# Patient Record
Sex: Female | Born: 1965 | Race: Black or African American | Hispanic: No | Marital: Single | Smoking: Never smoker
Health system: Southern US, Community
[De-identification: ages and names within clinical notes are randomized; demographics above are authoritative.]

## PROBLEM LIST (undated history)

## (undated) DIAGNOSIS — Z5189 Encounter for other specified aftercare: Secondary | ICD-10-CM

## (undated) DIAGNOSIS — T7840XA Allergy, unspecified, initial encounter: Secondary | ICD-10-CM

## (undated) DIAGNOSIS — F32A Depression, unspecified: Secondary | ICD-10-CM

## (undated) DIAGNOSIS — Z860100 Personal history of colon polyps, unspecified: Secondary | ICD-10-CM

## (undated) DIAGNOSIS — Z8669 Personal history of other diseases of the nervous system and sense organs: Secondary | ICD-10-CM

## (undated) DIAGNOSIS — A6920 Lyme disease, unspecified: Secondary | ICD-10-CM

## (undated) DIAGNOSIS — F329 Major depressive disorder, single episode, unspecified: Secondary | ICD-10-CM

## (undated) DIAGNOSIS — Z9049 Acquired absence of other specified parts of digestive tract: Secondary | ICD-10-CM

## (undated) DIAGNOSIS — J45909 Unspecified asthma, uncomplicated: Secondary | ICD-10-CM

## (undated) DIAGNOSIS — M069 Rheumatoid arthritis, unspecified: Secondary | ICD-10-CM

## (undated) DIAGNOSIS — Z8619 Personal history of other infectious and parasitic diseases: Secondary | ICD-10-CM

## (undated) DIAGNOSIS — M797 Fibromyalgia: Secondary | ICD-10-CM

## (undated) HISTORY — PX: CARPAL TUNNEL RELEASE: SHX101

## (undated) HISTORY — DX: Lyme disease, unspecified: A69.20

## (undated) HISTORY — DX: Personal history of colonic polyps: Z86.010

## (undated) HISTORY — DX: Allergy, unspecified, initial encounter: T78.40XA

## (undated) HISTORY — PX: TONSILLECTOMY: SHX5217

## (undated) HISTORY — PX: ABDOMINAL HYSTERECTOMY: SHX81

## (undated) HISTORY — PX: KNEE ARTHROPLASTY: SHX992

## (undated) HISTORY — DX: Major depressive disorder, single episode, unspecified: F32.9

## (undated) HISTORY — DX: Depression, unspecified: F32.A

## (undated) HISTORY — DX: Personal history of other infectious and parasitic diseases: Z86.19

## (undated) HISTORY — DX: Acquired absence of other specified parts of digestive tract: Z90.49

## (undated) HISTORY — DX: Encounter for other specified aftercare: Z51.89

## (undated) HISTORY — PX: TOTAL SHOULDER ARTHROPLASTY: SHX126

## (undated) HISTORY — DX: Fibromyalgia: M79.7

## (undated) HISTORY — DX: Rheumatoid arthritis, unspecified: M06.9

## (undated) HISTORY — DX: Personal history of other diseases of the nervous system and sense organs: Z86.69

## (undated) HISTORY — PX: SPINE SURGERY: SHX786

## (undated) HISTORY — DX: Personal history of colon polyps, unspecified: Z86.0100

---

## 2013-02-22 DIAGNOSIS — Z9049 Acquired absence of other specified parts of digestive tract: Secondary | ICD-10-CM

## 2013-02-22 HISTORY — DX: Acquired absence of other specified parts of digestive tract: Z90.49

## 2015-05-12 ENCOUNTER — Emergency Department (HOSPITAL_BASED_OUTPATIENT_CLINIC_OR_DEPARTMENT_OTHER)
Admission: EM | Admit: 2015-05-12 | Discharge: 2015-05-12 | Disposition: A | Discharge status: Home / Self Care | Payer: BLUE CROSS/BLUE SHIELD | Attending: Emergency Medicine | Admitting: Emergency Medicine

## 2015-05-12 ENCOUNTER — Emergency Department (HOSPITAL_BASED_OUTPATIENT_CLINIC_OR_DEPARTMENT_OTHER): Payer: BLUE CROSS/BLUE SHIELD

## 2015-05-12 ENCOUNTER — Encounter (HOSPITAL_BASED_OUTPATIENT_CLINIC_OR_DEPARTMENT_OTHER): Payer: Self-pay | Admitting: Emergency Medicine

## 2015-05-12 DIAGNOSIS — Z7951 Long term (current) use of inhaled steroids: Secondary | ICD-10-CM | POA: Insufficient documentation

## 2015-05-12 DIAGNOSIS — K219 Gastro-esophageal reflux disease without esophagitis: Secondary | ICD-10-CM | POA: Diagnosis not present

## 2015-05-12 DIAGNOSIS — IMO0001 Reserved for inherently not codable concepts without codable children: Secondary | ICD-10-CM

## 2015-05-12 DIAGNOSIS — H547 Unspecified visual loss: Secondary | ICD-10-CM | POA: Insufficient documentation

## 2015-05-12 DIAGNOSIS — Z79899 Other long term (current) drug therapy: Secondary | ICD-10-CM | POA: Diagnosis not present

## 2015-05-12 DIAGNOSIS — J45901 Unspecified asthma with (acute) exacerbation: Secondary | ICD-10-CM | POA: Insufficient documentation

## 2015-05-12 DIAGNOSIS — R079 Chest pain, unspecified: Secondary | ICD-10-CM | POA: Diagnosis present

## 2015-05-12 HISTORY — DX: Unspecified asthma, uncomplicated: J45.909

## 2015-05-12 LAB — CBC
HCT: 39 % (ref 36.0–46.0)
HEMOGLOBIN: 12.7 g/dL (ref 12.0–15.0)
MCH: 27.2 pg (ref 26.0–34.0)
MCHC: 32.6 g/dL (ref 30.0–36.0)
MCV: 83.5 fL (ref 78.0–100.0)
PLATELETS: 233 10*3/uL (ref 150–400)
RBC: 4.67 MIL/uL (ref 3.87–5.11)
RDW: 14.8 % (ref 11.5–15.5)
WBC: 7.7 10*3/uL (ref 4.0–10.5)

## 2015-05-12 LAB — BASIC METABOLIC PANEL
ANION GAP: 7 (ref 5–15)
BUN: 18 mg/dL (ref 6–20)
CALCIUM: 9.1 mg/dL (ref 8.9–10.3)
CO2: 26 mmol/L (ref 22–32)
Chloride: 104 mmol/L (ref 101–111)
Creatinine, Ser: 0.81 mg/dL (ref 0.44–1.00)
GLUCOSE: 96 mg/dL (ref 65–99)
POTASSIUM: 4.4 mmol/L (ref 3.5–5.1)
SODIUM: 137 mmol/L (ref 135–145)

## 2015-05-12 LAB — TROPONIN I: Troponin I: <0.03 ng/mL (ref <0.031)

## 2015-05-12 MED ORDER — RANITIDINE HCL 150 MG/10ML PO SYRP
150.0000 mg | ORAL_SOLUTION | Freq: Once | ORAL | Status: AC
  Administered 2015-05-12: 150 mg via ORAL
  Filled 2015-05-12: qty 10

## 2015-05-12 MED ORDER — RANITIDINE HCL 150 MG PO CAPS: 150.0000 mg | ORAL_CAPSULE | Freq: Every day | ORAL | Status: DC

## 2015-05-12 MED ORDER — ACETAMINOPHEN 325 MG PO TABS
650.0000 mg | ORAL_TABLET | Freq: Once | ORAL | Status: AC
  Administered 2015-05-12: 650 mg via ORAL
  Filled 2015-05-12: qty 2

## 2015-05-12 NOTE — Discharge Instructions (Signed)
Food Choices for Gastroesophageal Reflux Disease, Adult When you have gastroesophageal reflux disease (GERD), the foods you eat and your eating habits are very important. Choosing the right foods can help ease your discomfort.  WHAT GUIDELINES DO I NEED TO FOLLOW?   Choose fruits, vegetables, whole grains, and low-fat dairy products.   Choose low-fat meat, fish, and poultry.  Limit fats such as oils, salad dressings, butter, nuts, and avocado.   Keep a food diary. This helps you identify foods that cause symptoms.   Avoid foods that cause symptoms. These may be different for everyone.   Eat small meals often instead of 3 large meals a day.   Eat your meals slowly, in a place where you are relaxed.   Limit fried foods.   Cook foods using methods other than frying.   Avoid drinking alcohol.   Avoid drinking large amounts of liquids with your meals.   Avoid bending over or lying down until 2-3 hours after eating.  WHAT FOODS ARE NOT RECOMMENDED?  These are some foods and drinks that may make your symptoms worse: Vegetables Tomatoes. Tomato juice. Tomato and spaghetti sauce. Chili peppers. Onion and garlic. Horseradish. Fruits Oranges, grapefruit, and lemon (fruit and juice). Meats High-fat meats, fish, and poultry. This includes hot dogs, ribs, ham, sausage, salami, and bacon. Dairy Whole milk and chocolate milk. Sour cream. Cream. Butter. Ice cream. Cream cheese.  Drinks Coffee and tea. Bubbly (carbonated) drinks or energy drinks. Condiments Hot sauce. Barbecue sauce.  Sweets/Desserts Chocolate and cocoa. Donuts. Peppermint and spearmint. Fats and Oils High-fat foods. This includes [place] fries and potato chips. Other Vinegar. Strong spices. This includes black pepper, white pepper, red pepper, cayenne, curry powder, cloves, ginger, and chili powder. The items listed above may not be a complete list of foods and drinks to avoid. Contact your dietitian for more  information.   This information is not intended to replace advice given to you by your health care provider. Make sure you discuss any questions you have with your health care provider.   Document Released: 08/10/2011 Document Revised: 03/01/2014 Document Reviewed: 12/13/2012 Elsevier Interactive Patient Education 2016 Else[employer] Inc.  Gastroesophageal Reflux Disease, Adult Normally, food travels down the esophagus and stays in the stomach to be digested. If a person has gastroesophageal reflux disease (GERD), food and stomach acid move back up into the esophagus. When this happens, the esophagus becomes sore and swollen (inflamed). Over time, GERD can make small holes (ulcers) in the lining of the esophagus. HOME CARE Diet  Follow a diet as told by your doctor. You may need to avoid foods and drinks such as:  Coffee and tea (with or without caffeine).  Drinks that contain alcohol.  Energy drinks and sports drinks.  Carbonated drinks or sodas.  Chocolate and cocoa.  Peppermint and mint flavorings.  Garlic and onions.  Horseradish.  Spicy and acidic foods, such as peppers, chili powder, curry powder, vinegar, hot sauces, and BBQ sauce.  Citrus fruit juices and citrus fruits, such as oranges, lemons, and limes.  Tomato-based foods, such as red sauce, chili, salsa, and pizza with red sauce.  Fried and fatty foods, such as donuts, french fries, potato chips, and high-fat dressings.  High-fat meats, such as hot dogs, rib eye steak, sausage, ham, and bacon.  High-fat dairy items, such as whole milk, butter, and cream cheese.  Eat small meals often. Avoid eating large meals.  Avoid drinking large amounts of liquid with your meals.  Avoid eating meals  during the 2-3 hours before bedtime.  Avoid lying down right after you eat.  Do not exercise right after you eat. General Instructions  Pay attention to any changes in your symptoms.  Take over-the-counter and  prescription medicines only as told by your doctor. Do not take aspirin, ibuprofen, or other NSAIDs unless your doctor says it is okay.  Do not use any tobacco products, including cigarettes, chewing tobacco, and e-cigarettes. If you need help quitting, ask your doctor.  Wear loose clothes. Do not wear anything tight around your waist.  Raise (elevate) the head of your bed about 6 inches (15 cm).  Try to lower your stress. If you need help doing this, ask your doctor.  If you are overweight, lose an amount of weight that is healthy for you. Ask your doctor about a safe weight loss goal.  Keep all follow-up visits as told by your doctor. This is important. GET HELP IF:  You have new symptoms.  You lose weight and you do not know why it is happening.  You have trouble swallowing, or it hurts to swallow.  You have wheezing or a cough that keeps happening.  Your symptoms do not get better with treatment.  You have a hoarse voice. GET HELP RIGHT AWAY IF:  You have pain in your arms, neck, jaw, teeth, or back.  You feel sweaty, dizzy, or light-headed.  You have chest pain or shortness of breath.  You throw up (vomit) and your throw up looks like blood or coffee grounds.  You pass out (faint).  Your poop (stool) is bloody or black.  You cannot swallow, drink, or eat.   This information is not intended to replace advice given to you by your health care provider. Make sure you discuss any questions you have with your health care provider.   Document Released: 07/28/2007 Document Revised: 10/30/2014 Document Reviewed: 06/05/2014 Elsevier Interactive Patient Education [employer]

## 2015-05-12 NOTE — ED Provider Notes (Signed)
CSN: [id number]     Arrival date & time 05/12/15  2138 History  By signing my name below, I, Budd Palmer, attest that this documentation has been prepared under the direction and in the presence of Nelva Nay, MD. Electronically Signed: Budd Palmer, ED Scribe. 05/12/2015. 10:01 PM.     Chief Complaint  Patient presents with  . Chest Pain   The history is provided by the patient. No language interpreter was used.   HPI Comments: Julie Kramer is a 50 y.o. female with a PMHx of asthma and a PSHx of spine surgery who presents to the Emergency Department complaining of intermittent chest pain onset 1 week ago. Pt states that at first she felt as though it was her asthma acting up, but then the pain began to radiate into her left arm. She reports associated mild wheezing, loss of appetite, and brief loss of vision while at work today. She also endorses coughing, but states that this is baseline for her. She notes that a few nights ago she woke up in the middle of the night with palpitations. She has an inhaler at home which she has not used today. She notes she was diagnosed with heartburn and was given medication for this by her PCP, which she takes PRN. She denies a PMHx of HTN and DM, as well as taking any medication for high cholesterol.  Pt is allergic to hydrocodone.   Past Medical History  Diagnosis Date  . Asthma    Past Surgical History  Procedure Laterality Date  . Spine surgery     History reviewed. No pertinent family history. Social History  Substance Use Topics  . Smoking status: Never Smoker   . Smokeless tobacco: None  . Alcohol Use: No   OB History    No data available     Review of Systems  Constitutional: Positive for appetite change.  Eyes: Positive for visual disturbance.  Respiratory: Positive for cough (baseline) and wheezing.   Cardiovascular: Positive for chest pain.  All other systems reviewed and are negative.   Allergies   Hydrocodone  Home Medications   Prior to Admission medications   Medication Sig Start Date End Date Taking? Authorizing Provider  albuterol (PROVENTIL HFA;VENTOLIN HFA) 108 (90 Base) MCG/ACT inhaler Inhale into the lungs every 6 (six) hours as needed for wheezing or shortness of breath.   Yes Historical Provider, MD  budesonide-formoterol (SYMBICORT) 160-4.5 MCG/ACT inhaler Inhale 2 puffs into the lungs 2 (two) times daily.   Yes Historical Provider, MD   BP 138/101 mmHg  Pulse 83  Temp(Src) 98.1 F (36.7 C) (Oral)  Resp 18  Ht 5\' 2"  (1.575 m)  Wt 186 lb (84.369 kg)  BMI 34.01 kg/m2  SpO2 100% Physical Exam Physical Exam  Nursing note and vitals reviewed. Constitutional: She is oriented to person, place, and time. She appears well-developed and well-nourished. No distress.  HENT:  Head: Normocephalic and atraumatic.  Eyes: Pupils are equal, round, and reactive to light.  Neck: Normal range of motion.  Cardiovascular: Normal rate and intact distal pulses.   Pulmonary/Chest: No respiratory distress.  Abdominal: Normal appearance. She exhibits no distension.  Musculoskeletal: Normal range of motion.  Neurological: She is alert and oriented to person, place, and time. No cranial nerve deficit.  Skin: Skin is warm and dry. No rash noted.   ED Course  Procedures  Medications  ranitidine (ZANTAC) 150 MG/10ML syrup 150 mg (150 mg Oral Given 05/12/15 2321)  acetaminophen (TYLENOL) tablet 650  mg (650 mg Oral Given 05/12/15 2321)    DIAGNOSTIC STUDIES: Oxygen Saturation is 100% on RA, normal by my interpretation.    COORDINATION OF CARE: 10:00 PM - Discussed normal EKG and plans to order diagnostic studies and imaging. Pt advised of plan for treatment and pt agrees.  Labs Review Labs Reviewed  BASIC METABOLIC PANEL  CBC  TROPONIN I    Imaging Review Dg Chest 2 View  05/12/2015  CLINICAL DATA:  Chest pain and shortness of breath for 2 weeks. Asthma. EXAM: CHEST  2 VIEW  COMPARISON:  None. FINDINGS: The heart size and mediastinal contours are within normal limits. Both lungs are clear. The visualized skeletal structures are unremarkable. IMPRESSION: No active cardiopulmonary disease. Electronically Signed   By: Burman Nieves M.D.   On: 05/12/2015 22:54   I have personally reviewed and evaluated these images and lab results as part of my medical decision-making.   Date: 05/21/2015  Rate: 77  Rhythm: normal sinus rhythm  QRS Axis: normal  Intervals: normal  ST/T Wave abnormalities: normal  Conduction Disutrbances: none  Narrative Interpretation: unremarkable      MDM   Final diagnoses:  None    I personally performed the services described in this documentation, which was scribed in my presence. The recorded information has been reviewed and considered.   Nelva Nay, MD 05/21/15 [phone]

## 2015-05-12 NOTE — ED Notes (Signed)
MD came to room and said that he does not need an IV.  Said we could do a straight draw for the blood.

## 2015-05-12 NOTE — ED Notes (Signed)
Patient states that she felt like her asthmas was acting up. For the last week she has had intermittent pain to her chest. The pateint reports that she is getting dizzy and vision is going black. The patient that she is having pain and numbness to her left arm today and that is what bothered her

## 2015-05-12 NOTE — ED Notes (Signed)
Patient transported to X-ray 

## 2015-06-13 ENCOUNTER — Telehealth: Payer: Self-pay | Admitting: Behavioral Health

## 2015-06-13 NOTE — Telephone Encounter (Signed)
Unable to reach patient at time of Pre-Visit Call.  Left message for patient to return call when available.    

## 2015-06-16 ENCOUNTER — Encounter: Payer: Self-pay | Admitting: Family

## 2015-06-16 ENCOUNTER — Ambulatory Visit (INDEPENDENT_AMBULATORY_CARE_PROVIDER_SITE_OTHER): Payer: BLUE CROSS/BLUE SHIELD | Admitting: Family

## 2015-06-16 VITALS — BP 123/80 | HR 84 | Temp 98.0°F | Resp 16 | Ht 62.0 in | Wt 198.6 lb

## 2015-06-16 DIAGNOSIS — J45909 Unspecified asthma, uncomplicated: Secondary | ICD-10-CM | POA: Diagnosis not present

## 2015-06-16 DIAGNOSIS — R110 Nausea: Secondary | ICD-10-CM

## 2015-06-16 DIAGNOSIS — G4700 Insomnia, unspecified: Secondary | ICD-10-CM

## 2015-06-16 MED ORDER — CETIRIZINE HCL 10 MG PO TABS: 10.0000 mg | ORAL_TABLET | Freq: Every day | ORAL | Status: DC

## 2015-06-16 MED ORDER — OMEPRAZOLE 40 MG PO CPDR
40.0000 mg | DELAYED_RELEASE_CAPSULE | Freq: Every day | ORAL | Status: DC
Start: 2015-06-16 — End: 2016-10-08

## 2015-06-16 MED ORDER — MONTELUKAST SODIUM 10 MG PO TABS: 10.0000 mg | ORAL_TABLET | Freq: Every day | ORAL | Status: DC

## 2015-06-16 NOTE — Patient Instructions (Addendum)
Complete lab work prior to leaving. Avoid use of screens for 1.5 hours before bedtime. Try to get some exercise every day. Add melatonin 1mg  one hour before bedtime.   Stop goody powder and stop any anti-inflammatory (Aleve, motrin, ibuprofen) You may use tylenol as needed for HA. Do not exceed 3000mg  once daily.  Start prilosec once daily. Stop Zantac. Do not drink any caffeinated drinks after 12 noon (Red Bull, mountain dew, pepsi). Welcome to !

## 2015-06-16 NOTE — Progress Notes (Signed)
Subjective:    Patient ID: Julie Kramer, female    DOB: [birth year], 50 y.o.   MRN: [id number]  HPI  Julie Kramer is a 50 yr old female who presents today to establish care.  Moved in August 2017.   She has chief complaint of 1 week hx of nausea.  Nausea lasts until 10 AM.  Tried some otc baking soda.  This seemed to help some.  Reports + hx of IBS with diarrhea, stools are at baseline.  She uses zantac as needed.  She reports that she has been using frequent goodie powders due to HA and to sleep.  She attributes HA to sinus congestion.    Asthma/alleriges- reports that she has followed with asthma/allergy specialists. Prior to moving here from [place] she was to begin allergy injections.  Reports hx of severe asthma.  On symbicort which helps.  Uses albuterol 3 times a week.    Insomnia-  Reports chronic issues.  She reports that she has cut back on caffeine.  Reports that she has tried nyquil sleep which only helps some.  She has not tried melatonin. Lays in the bed at 9:30.  She watches TV, watches game on her phone.  She reports that she naps during the day.  She reports that she rarely snores per boyfriend.  She is drinking 2 red bulls, then drinks pepsi  Review of Systems See HPI  Past Medical History  Diagnosis Date  . Asthma   . History of chicken pox   . Depression   . Allergy   . History of migraine   . History of colon polyps   . Blood transfusion without reported diagnosis 1990s  . 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 Marital Status: Single    Spouse Name: N/A  . Number of Children: N/A  . Years of Education: N/A   Occupational History  . Not on file.   Social History Main Topics  . Smoking status: Never Smoker   . Smokeless tobacco: Not on file  . Alcohol Use: No  . Drug Use: No  . Sexual Activity: Not on file   Other Topics Concern  . Not on file   Social History Narrative   2 children, both grown one lives in [place] (son) he has 3 children, and one in Prison (son)   Works for [employer] in [employer].   Single,  Completed 12th grade   Has a dog.    Enjoys relaxing, sleeping    Past Surgical History  Procedure Laterality Date  . Spine surgery    . Tonsillectomy  1990s  . Abdominal hysterectomy  1990s    Family History  Problem Relation Age of Onset  . Heart disease Mother   . Arthritis Father   . Heart disease Father   . Stroke Father   . Diabetes Father   . Hypertension Father   . Heart attack Father   . Stroke Sister   . Diabetes Sister   . Diabetes Brother   . Stroke Brother   . Diabetes Daughter   . Stroke Paternal Aunt   . Hypertension Paternal Aunt   . Diabetes Paternal Aunt   . Stroke Paternal Uncle   . Hypertension Paternal Uncle   . Diabetes Paternal Uncle   . Arthritis Maternal Grandmother   . Arthritis Maternal Grandfather   . Arthritis Paternal Grandmother   .  Arthritis Paternal Grandfather   . Diabetes Brother   . Stroke Brother     Allergies  Allergen Reactions  . Hydrocodone Shortness Of Breath  . Iodine Itching  . Morphine And Related Itching  . Codeine Nausea And Vomiting    Current Outpatient Prescriptions on File Prior to Visit  Medication Sig Dispense Refill  . albuterol (PROVENTIL HFA;VENTOLIN HFA) 108 (90 Base) MCG/ACT inhaler Inhale into the lungs every 6 (six) hours as needed for wheezing or shortness of breath.    . budesonide-formoterol (SYMBICORT) 160-4.5 MCG/ACT inhaler Inhale 2 puffs into the lungs 2 (two) times daily.     No current facility-administered medications on file prior to visit.    BP 123/80 mmHg  Pulse 84  Temp(Src) 98 F (36.7 C) (Oral)  Resp 16  Ht 5\' 2"  (1.575 m)  Wt 198 lb 9.6 oz (90.084 kg)  BMI 36.32 kg/m2  SpO2 100%       Objective:   Physical Exam  Constitutional: She is oriented to person, place, and time. She appears well-developed and well-nourished.    HENT:  Head: Normocephalic and atraumatic.  Right Ear: Tympanic membrane and ear canal normal.  Left Ear: Tympanic membrane and ear canal normal.  Mouth/Throat: No oropharyngeal exudate, posterior oropharyngeal edema or posterior oropharyngeal erythema.  Eyes: No scleral icterus.  Cardiovascular: Normal rate, regular rhythm and normal heart sounds.   No murmur heard. Pulmonary/Chest: Effort normal and breath sounds normal. No respiratory distress. She has no wheezes.  Abdominal: Soft. She exhibits no distension. There is no tenderness.  Musculoskeletal: She exhibits no edema.  Neurological: She is alert and oriented to person, place, and time.  Skin: Skin is warm and dry.  Psychiatric: She has a normal mood and affect. Her behavior is normal. Judgment and thought content normal.          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 Advised pt to d/c all NSAID and ASA containing products. D/c zantac, trial of omeprazole. Obtain cbc and LFT to further evaluate.   If symptoms persist, consider further imaging/GI referral.

## 2015-06-16 NOTE — Progress Notes (Signed)
Pre visit review using our clinic review tool, if applicable. No additional management support is needed unless otherwise documented below in the visit note. 

## 2015-06-17 DIAGNOSIS — J45909 Unspecified asthma, uncomplicated: Secondary | ICD-10-CM | POA: Insufficient documentation

## 2015-06-17 DIAGNOSIS — G4709 Other insomnia: Secondary | ICD-10-CM | POA: Insufficient documentation

## 2015-06-17 NOTE — Assessment & Plan Note (Signed)
Advised pt to d/c all caffeine containing beverages. Discussed good sleep hygiene, trial of melatonin.  Consider sleep study if daytime sleepiness does not improve with above measures.

## 2015-06-17 NOTE — Assessment & Plan Note (Signed)
Stable on current meds. Will refer to allergist for ongoing management of allergies and asthma.  Continue symbicort and prn albuterol.

## 2015-07-14 ENCOUNTER — Telehealth: Payer: Self-pay | Admitting: Family

## 2015-07-14 ENCOUNTER — Ambulatory Visit: Payer: BLUE CROSS/BLUE SHIELD | Admitting: Family

## 2015-07-15 NOTE — Telephone Encounter (Signed)
Pt was no show 07/14/15, she called today and thought appt was 5/24, rescheduled for 5/24, charge or no charge?

## 2015-07-15 NOTE — Telephone Encounter (Signed)
No charge. Thanks.  

## 2015-07-16 ENCOUNTER — Encounter: Payer: Self-pay | Admitting: Family

## 2015-07-16 ENCOUNTER — Ambulatory Visit (INDEPENDENT_AMBULATORY_CARE_PROVIDER_SITE_OTHER): Payer: BLUE CROSS/BLUE SHIELD | Admitting: Family

## 2015-07-16 VITALS — BP 120/56 | HR 80 | Temp 98.4°F | Resp 18 | Ht 62.0 in | Wt 201.6 lb

## 2015-07-16 DIAGNOSIS — Z23 Encounter for immunization: Secondary | ICD-10-CM

## 2015-07-16 DIAGNOSIS — G4700 Insomnia, unspecified: Secondary | ICD-10-CM

## 2015-07-16 DIAGNOSIS — Z0000 Encounter for general adult medical examination without abnormal findings: Secondary | ICD-10-CM

## 2015-07-16 DIAGNOSIS — M2550 Pain in unspecified joint: Secondary | ICD-10-CM

## 2015-07-16 DIAGNOSIS — A6920 Lyme disease, unspecified: Secondary | ICD-10-CM | POA: Diagnosis not present

## 2015-07-16 LAB — CK: CK TOTAL: 76 U/L (ref 7–177)

## 2015-07-16 LAB — TSH: TSH: 0.86 u[IU]/mL (ref 0.35–4.50)

## 2015-07-16 LAB — SEDIMENTATION RATE: SED RATE: 23 mm/h (ref 0–30)

## 2015-07-16 LAB — RHEUMATOID FACTOR: Rhuematoid fact SerPl-aCnc: 43 IU/mL — ABNORMAL HIGH (ref <=14)

## 2015-07-16 MED ORDER — ZOLPIDEM TARTRATE 5 MG PO TABS: 5.0000 mg | ORAL_TABLET | Freq: Every evening | ORAL | Status: DC | PRN

## 2015-07-16 NOTE — Progress Notes (Signed)
Pre visit review using our clinic review tool, if applicable. No additional management support is needed unless otherwise documented below in the visit note. 

## 2015-07-16 NOTE — Progress Notes (Signed)
Subjective:    Patient ID: Julie Kramer, female    DOB: 09/24/1965, 50 y.o.   MRN: [id number]  HPI  [name] is a 50 yr old female who presents today with several concerns:  1) Insomnia- avoiding caffeine, screens before bed, has tried melatonin.  Still not sleeping well despite these changes. Reports that she falls asleep but can't stay asleep. Denies snoring.  Reports that bedtime is usually around 11:30PM.    2) myalgia/arthralgia- Not on statin. Notes that muscle/joint pain is intermittent.   Review of Systems  Gastrointestinal: Negative for nausea.   Past Medical History  Diagnosis Date  . Asthma   . History of chicken pox   . Depression   . Allergy   . History of migraine   . History of colon polyps   . Blood transfusion without reported diagnosis 1990s  . 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 Marital Status: Single    Spouse Name: N/A  . Number of Children: N/A  . Years of Education: N/A   Occupational History  . Not on file.   Social History Main Topics  . Smoking status: Never Smoker   . Smokeless tobacco: Not on file  . Alcohol Use: No  . Drug Use: No  . Sexual Activity: Not on file   Other Topics Concern  . Not on file   Social History Narrative   2 children, both grown one lives in [place] (son) he has 3 children, and one in Prison (son)   Works for [employer] in [employer].   Single,  Completed 12th grade   Has a dog.    Enjoys relaxing, sleeping    Past Surgical History  Procedure Laterality Date  . Spine surgery    . Tonsillectomy  1990s  . Abdominal hysterectomy  1990s    Family History  Problem Relation Age of Onset  . Heart disease Mother   . Arthritis Father   . Heart disease Father   . Stroke Father   . Diabetes Father   . Hypertension Father   . Heart attack Father   . Stroke Sister   . Diabetes Sister   . Diabetes Brother   .  Stroke Brother   . Diabetes Daughter   . Stroke Paternal Aunt   . Hypertension Paternal Aunt   . Diabetes Paternal Aunt   . Stroke Paternal Uncle   . Hypertension Paternal Uncle   . Diabetes Paternal Uncle   . Arthritis Maternal Grandmother   . Arthritis Maternal Grandfather   . Arthritis Paternal Grandmother   . Arthritis Paternal Grandfather   . Diabetes Brother   . Stroke Brother     Allergies  Allergen Reactions  . Hydrocodone Shortness Of Breath  . Iodine Itching  . Morphine And Related Itching  . Codeine Nausea And Vomiting    Current Outpatient Prescriptions on File Prior to Visit  Medication Sig Dispense Refill  . albuterol (PROVENTIL HFA;VENTOLIN HFA) 108 (90 Base) MCG/ACT inhaler Inhale into the lungs every 6 (six) hours as needed for wheezing or shortness of breath.    . benzonatate (TESSALON) 100 MG capsule Take 1-2 tablets 2 - 3 times a day for cough.    . budesonide-formoterol (SYMBICORT) 160-4.5 MCG/ACT inhaler Inhale 2 puffs into the lungs 2 (two) times daily.    . cetirizine (ZYRTEC) 10 MG tablet Take 1 tablet (  10 mg total) by mouth daily. 30 tablet 11  . montelukast (SINGULAIR) 10 MG tablet Take 1 tablet (10 mg total) by mouth at bedtime. 30 tablet 3  . omeprazole (PRILOSEC) 40 MG capsule Take 1 capsule (40 mg total) by mouth daily. 30 capsule 3   No current facility-administered medications on file prior to visit.    BP 120/56 mmHg  Pulse 80  Temp(Src) 98.4 F (36.9 C) (Oral)  Resp 18  Ht 5\' 2"  (1.575 m)  Wt 201 lb 9.6 oz (91.445 kg)  BMI 36.86 kg/m2  SpO2 100%       Objective:   Physical Exam  Constitutional: She appears well-developed and well-nourished.  Cardiovascular: Normal rate, regular rhythm and normal heart sounds.   No murmur heard. Pulmonary/Chest: Effort normal and breath sounds normal. No respiratory distress. She has no wheezes.  Musculoskeletal:  No joint swelling noted  Psychiatric: She has a normal mood and affect. Her  behavior is normal. Judgment and thought content normal.          Assessment & Plan:

## 2015-07-16 NOTE — Patient Instructions (Addendum)
Please complete lab work prior to leaving including (UDS) Begin ambien once daily at bedtime as needed.

## 2015-07-17 LAB — ANA: ANA: NEGATIVE

## 2015-07-18 ENCOUNTER — Telehealth: Payer: Self-pay | Admitting: Family

## 2015-07-18 LAB — LYME ABY, WSTRN BLT IGG & IGM W/BANDS
B BURGDORFERI IGM ABS (IB): NEGATIVE
B burgdorferi IgG Abs (IB): NEGATIVE
LYME DISEASE 18 KD IGG: NONREACTIVE
LYME DISEASE 30 KD IGG: NONREACTIVE
LYME DISEASE 41 KD IGM: NONREACTIVE
LYME DISEASE 58 KD IGG: NONREACTIVE
Lyme Disease 23 kD IgG: NONREACTIVE
Lyme Disease 23 kD IgM: NONREACTIVE
Lyme Disease 28 kD IgG: REACTIVE — AB
Lyme Disease 39 kD IgG: NONREACTIVE
Lyme Disease 39 kD IgM: NONREACTIVE
Lyme Disease 41 kD IgG: NONREACTIVE
Lyme Disease 45 kD IgG: NONREACTIVE
Lyme Disease 66 kD IgG: NONREACTIVE
Lyme Disease 93 kD IgG: NONREACTIVE

## 2015-07-18 MED ORDER — DOXYCYCLINE HYCLATE 100 MG PO TABS: 100.0000 mg | ORAL_TABLET | Freq: Two times a day (BID) | ORAL | Status: DC

## 2015-07-18 NOTE — Telephone Encounter (Signed)
Rheumatoid factor is +  Lyme disease testing is positive.  Start doxycycline x 28 days.  Needs follow up in 1 month.  Attempted to reach patient- no answer.

## 2015-07-22 DIAGNOSIS — A6920 Lyme disease, unspecified: Secondary | ICD-10-CM | POA: Insufficient documentation

## 2015-07-22 NOTE — Assessment & Plan Note (Signed)
Uncontrolled despite efforts to improve sleep hygiene.  Trial of ambien.

## 2015-07-22 NOTE — Telephone Encounter (Signed)
Left message for pt to return my call.

## 2015-07-22 NOTE — Assessment & Plan Note (Signed)
Lyme IgG is +. Rx with doxycyline x 28 days.

## 2015-07-23 NOTE — Telephone Encounter (Signed)
Left message for pt to return my call.

## 2015-07-25 NOTE — Telephone Encounter (Signed)
Attempted to reach pt and received voicemail. Did not leave message. Mailed letter.

## 2015-08-04 ENCOUNTER — Ambulatory Visit (HOSPITAL_BASED_OUTPATIENT_CLINIC_OR_DEPARTMENT_OTHER)
Admission: RE | Admit: 2015-08-04 | Discharge: 2015-08-04 | Disposition: A | Discharge status: Home / Self Care | Payer: BLUE CROSS/BLUE SHIELD | Source: Ambulatory Visit | Attending: Family | Admitting: Family

## 2015-08-04 DIAGNOSIS — Z1231 Encounter for screening mammogram for malignant neoplasm of breast: Secondary | ICD-10-CM | POA: Diagnosis not present

## 2015-08-04 DIAGNOSIS — Z0000 Encounter for general adult medical examination without abnormal findings: Secondary | ICD-10-CM

## 2015-08-04 NOTE — Telephone Encounter (Signed)
Received call from pt that she received letter with below results. She has not started antibiotic yet but will pick it up from the pharmacy today. Pt has follow up with PCP on 08/12/15 and will keep that appt.  Pt states she continues to be very fatigued and having intermittent nausea. Advised pt to let us know if symptoms do not improve or worsen after antibiotic and she voices understanding.

## 2015-08-12 ENCOUNTER — Encounter: Payer: Self-pay | Admitting: Family

## 2015-08-12 ENCOUNTER — Ambulatory Visit (INDEPENDENT_AMBULATORY_CARE_PROVIDER_SITE_OTHER): Payer: BLUE CROSS/BLUE SHIELD | Admitting: Family

## 2015-08-12 VITALS — BP 120/80 | HR 83 | Temp 98.5°F | Resp 18 | Ht 62.0 in | Wt 198.0 lb

## 2015-08-12 DIAGNOSIS — Z0000 Encounter for general adult medical examination without abnormal findings: Secondary | ICD-10-CM

## 2015-08-12 DIAGNOSIS — Z113 Encounter for screening for infections with a predominantly sexual mode of transmission: Secondary | ICD-10-CM | POA: Diagnosis not present

## 2015-08-12 DIAGNOSIS — R768 Other specified abnormal immunological findings in serum: Secondary | ICD-10-CM | POA: Diagnosis not present

## 2015-08-12 DIAGNOSIS — H547 Unspecified visual loss: Secondary | ICD-10-CM

## 2015-08-12 DIAGNOSIS — A6920 Lyme disease, unspecified: Secondary | ICD-10-CM

## 2015-08-12 DIAGNOSIS — J45909 Unspecified asthma, uncomplicated: Secondary | ICD-10-CM

## 2015-08-12 LAB — CBC WITH DIFFERENTIAL/PLATELET
BASOS ABS: 0 10*3/uL (ref 0.0–0.1)
Basophils Relative: 0.6 % (ref 0.0–3.0)
EOS PCT: 2.4 % (ref 0.0–5.0)
Eosinophils Absolute: 0.2 10*3/uL (ref 0.0–0.7)
HEMATOCRIT: 37.9 % (ref 36.0–46.0)
HEMOGLOBIN: 12.3 g/dL (ref 12.0–15.0)
LYMPHS PCT: 33.3 % (ref 12.0–46.0)
Lymphs Abs: 2.2 10*3/uL (ref 0.7–4.0)
MCHC: 32.5 g/dL (ref 30.0–36.0)
MCV: 83.2 fl (ref 78.0–100.0)
MONOS PCT: 7.6 % (ref 3.0–12.0)
Monocytes Absolute: 0.5 10*3/uL (ref 0.1–1.0)
NEUTROS PCT: 56.1 % (ref 43.0–77.0)
Neutro Abs: 3.7 10*3/uL (ref 1.4–7.7)
Platelets: 234 10*3/uL (ref 150.0–400.0)
RBC: 4.56 Mil/uL (ref 3.87–5.11)
RDW: 15.1 % (ref 11.5–15.5)
WBC: 6.6 10*3/uL (ref 4.0–10.5)

## 2015-08-12 LAB — BASIC METABOLIC PANEL
BUN: 17 mg/dL (ref 6–23)
CALCIUM: 9.2 mg/dL (ref 8.4–10.5)
CO2: 26 mEq/L (ref 19–32)
Chloride: 108 mEq/L (ref 96–112)
Creatinine, Ser: 0.77 mg/dL (ref 0.40–1.20)
GFR: 101.4 mL/min (ref >60.00)
GLUCOSE: 98 mg/dL (ref 70–99)
POTASSIUM: 4.3 meq/L (ref 3.5–5.1)
SODIUM: 137 meq/L (ref 135–145)

## 2015-08-12 LAB — HEPATIC FUNCTION PANEL
ALBUMIN: 4 g/dL (ref 3.5–5.2)
ALK PHOS: 127 U/L — AB (ref 39–117)
ALT: 39 U/L — AB (ref 0–35)
AST: 26 U/L (ref 0–37)
Bilirubin, Direct: 0.1 mg/dL (ref 0.0–0.3)
Total Bilirubin: 0.4 mg/dL (ref 0.2–1.2)
Total Protein: 7.1 g/dL (ref 6.0–8.3)

## 2015-08-12 LAB — URINALYSIS, ROUTINE W REFLEX MICROSCOPIC
BILIRUBIN URINE: NEGATIVE
Hgb urine dipstick: NEGATIVE
KETONES UR: NEGATIVE
LEUKOCYTES UA: NEGATIVE
NITRITE: NEGATIVE
PH: 5.5 (ref 5.0–8.0)
RBC / HPF: NONE SEEN (ref 0–?)
Specific Gravity, Urine: 1.025 (ref 1.000–1.030)
TOTAL PROTEIN, URINE-UPE24: NEGATIVE
UROBILINOGEN UA: 0.2 (ref 0.0–1.0)
Urine Glucose: NEGATIVE
WBC UA: NONE SEEN (ref 0–?)

## 2015-08-12 LAB — HIV ANTIBODY (ROUTINE TESTING W REFLEX): HIV: NONREACTIVE

## 2015-08-12 LAB — LIPID PANEL
CHOLESTEROL: 170 mg/dL (ref 0–200)
HDL: 38.2 mg/dL — ABNORMAL LOW (ref >39.00)
LDL Cholesterol: 114 mg/dL — ABNORMAL HIGH (ref 0–99)
NONHDL: 132.17
Total CHOL/HDL Ratio: 4
Triglycerides: 92 mg/dL (ref 0.0–149.0)
VLDL: 18.4 mg/dL (ref 0.0–40.0)

## 2015-08-12 LAB — HEPATITIS C ANTIBODY: HCV AB: NEGATIVE

## 2015-08-12 MED ORDER — SUMATRIPTAN SUCCINATE 50 MG PO TABS: ORAL_TABLET | ORAL | Status: DC

## 2015-08-12 MED ORDER — ONDANSETRON 4 MG PO TBDP: 4.0000 mg | ORAL_TABLET | Freq: Three times a day (TID) | ORAL | Status: DC | PRN

## 2015-08-12 NOTE — Progress Notes (Signed)
Pre visit review using our clinic review tool, if applicable. No additional management support is needed unless otherwise documented below in the visit note. 

## 2015-08-12 NOTE — Patient Instructions (Addendum)
Please complete lab work prior to leaving. Continue to work on [employer], exercise and weight loss. Please schedule dental appointment. You will be contacted about your referral to the eye doctor and referral to rheumatology.  Take symbicort 2x a day and singulair every day.   Add imitrex as needed for migrain.

## 2015-08-12 NOTE — Progress Notes (Signed)
Subjective:    Patient ID: Jamesina Gaugh, female    DOB: 03-10-1965, 50 y.o.   MRN: [id number]  HPI  Patient presents today for complete physical.  Immunizations: 07/16/15 Diet: working on improving her diet Exercise: active at work, no formal exercise Colonoscopy: 2013 Pap Smear:  hysterectomy Mammogram:  08/05/15 Vision:  Last eye exam was 2015- polyps per patient. Recommended 3-5 yr follow up.  Dental:  overdue   Lyme Disease- last visit lyme disease 28 kD IgG was positive.  She was started on doxycycline. Pt reports that she continues to have daily nausea/body aches and fatigue. Last visit her rheumatoid factor was noted to be elevated.    Asthma- reports that asthma has worsened in the heat recently. Feels more SOB recently. She is using albuterol more frequently. She is using symbicort only 3-4 times a week. She continues singulair on an "as needed basis."    Review of Systems  Constitutional: Negative for unexpected weight change.  HENT: Positive for rhinorrhea.   Eyes:       Feels like she needs reading glasses, needs apt with eye doctor  Respiratory: Negative for cough.   Gastrointestinal:       Occasional diarrhea, due to ibs  Genitourinary: Negative for dysuria and frequency.  Musculoskeletal: Positive for arthralgias.  Skin: Negative for rash.  Neurological: Positive for headaches.       Reports that she sometimes develops bad HA and vision changes.    Hematological: Negative for adenopathy.  Psychiatric/Behavioral:       Denies depression/anxiety     Past Medical History  Diagnosis Date  . Asthma   . History of chicken pox   . Depression   . Allergy   . History of migraine   . History of colon polyps   . Blood transfusion without reported diagnosis 1990s  . 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 Marital Status: Single    Spouse Name: N/A  . Number of Children: N/A  .  Years of Education: N/A   Occupational History  . Not on file.   Social History Main Topics  . Smoking status: Never Smoker   . Smokeless tobacco: Not on file  . Alcohol Use: No  . Drug Use: No  . Sexual Activity: Not on file   Other Topics Concern  . Not on file   Social History Narrative   2 children, both grown one lives in [place] (son) he has 3 children, and one in Prison (son)   Works for [employer] in [employer].   Single,  Completed 12th grade   Has a dog.    Enjoys relaxing, sleeping    Past Surgical History  Procedure Laterality Date  . Spine surgery    . Tonsillectomy  1990s  . Abdominal hysterectomy  1990s    Family History  Problem Relation Age of Onset  . Heart disease Mother   . Arthritis Father   . Heart disease Father   . Stroke Father   . Diabetes Father   . Hypertension Father   . Heart attack Father   . Stroke Sister   . Diabetes Sister   . Diabetes Brother   . Stroke Brother   . Diabetes Daughter   . Stroke Paternal Aunt   . Hypertension Paternal Aunt   . Diabetes Paternal Aunt   . Stroke Paternal Uncle   .  Hypertension Paternal Uncle   . Diabetes Paternal Uncle   . Arthritis Maternal Grandmother   . Arthritis Maternal Grandfather   . Arthritis Paternal Grandmother   . Arthritis Paternal Grandfather   . Diabetes Brother   . Stroke Brother     Allergies  Allergen Reactions  . Hydrocodone Shortness Of Breath  . Iodine Itching  . Morphine And Related Itching  . Codeine Nausea And Vomiting    Current Outpatient Prescriptions on File Prior to Visit  Medication Sig Dispense Refill  . albuterol (PROVENTIL HFA;VENTOLIN HFA) 108 (90 Base) MCG/ACT inhaler Inhale into the lungs every 6 (six) hours as needed for wheezing or shortness of breath.    . benzonatate (TESSALON) 100 MG capsule Take 100 mg by mouth as needed. Reported on 08/12/2015    . budesonide-formoterol (SYMBICORT) 160-4.5 MCG/ACT inhaler Inhale 2 puffs into the lungs 2  (two) times daily.    . cetirizine (ZYRTEC) 10 MG tablet Take 1 tablet (10 mg total) by mouth daily. 30 tablet 11  . doxycycline (VIBRA-TABS) 100 MG tablet Take 1 tablet (100 mg total) by mouth 2 (two) times daily. 56 tablet 0  . montelukast (SINGULAIR) 10 MG tablet Take 1 tablet (10 mg total) by mouth at bedtime. 30 tablet 3  . omeprazole (PRILOSEC) 40 MG capsule Take 1 capsule (40 mg total) by mouth daily. 30 capsule 3  . zolpidem (AMBIEN) 5 MG tablet Take 1 tablet (5 mg total) by mouth at bedtime as needed for sleep. 30 tablet 0   No current facility-administered medications on file prior to visit.    BP 120/80 mmHg  Pulse 83  Temp(Src) 98.5 F (36.9 C) (Oral)  Resp 18  Ht 5\' 2"  (1.575 m)  Wt 198 lb (89.812 kg)  BMI 36.21 kg/m2  SpO2 99%       Objective:   Physical Exam Physical Exam  Constitutional: She is oriented to person, place, and time. She appears well-developed and well-nourished. No distress.  HENT:  Head: Normocephalic and atraumatic.  Right Ear: Tympanic membrane and ear canal normal.  Left Ear: Tympanic membrane and ear canal normal.  Mouth/Throat: Oropharynx is clear and moist.  Eyes: Pupils are equal, round, and reactive to light. No scleral icterus.  Neck: Normal range of motion. No thyromegaly present.  Cardiovascular: Normal rate and regular rhythm.   No murmur heard. Pulmonary/Chest: Effort normal and breath sounds normal. No respiratory distress. He has no wheezes. She has no rales. She exhibits no tenderness.  Abdominal: Soft. Bowel sounds are normal. He exhibits no distension and no mass. There is no tenderness. There is no rebound and no guarding.  Musculoskeletal: She exhibits no edema.  Lymphadenopathy:    She has no cervical adenopathy.  Neurological: She is alert and oriented to person, place, and time. She has normal patellar reflexes. She exhibits normal muscle tone. Coordination normal.  Skin: Skin is warm and dry.  Psychiatric: She has a  normal mood and affect. Her behavior is normal. Judgment and thought content normal.  Breasts: Examined lying Right: Without masses, retractions, discharge or axillary adenopathy.  Left: Without masses, retractions, discharge or axillary adenopathy.          Assessment & Plan:          Assessment & Plan:

## 2015-08-13 DIAGNOSIS — Z0000 Encounter for general adult medical examination without abnormal findings: Secondary | ICD-10-CM | POA: Insufficient documentation

## 2015-08-13 DIAGNOSIS — R768 Other specified abnormal immunological findings in serum: Secondary | ICD-10-CM | POA: Insufficient documentation

## 2015-08-13 NOTE — Assessment & Plan Note (Signed)
Uncontrolled. Advised pt to begin bid use of symbicort and daily use of singulair.

## 2015-08-13 NOTE — Assessment & Plan Note (Signed)
Advised pt to complete doxy. She is having some nausea with doxy (has 1 week to go on rx). Rx sent for prn zofran and pt was instructed to take med with food.

## 2015-08-13 NOTE — Assessment & Plan Note (Signed)
Refer to rheumatology for further evaluation 

## 2015-08-13 NOTE — Assessment & Plan Note (Signed)
Discussed healthy diet, exercise, weight loss. Obtain routine lab work. Immunizations up to date. Recommended routine dental care.

## 2015-11-21 ENCOUNTER — Other Ambulatory Visit (INDEPENDENT_AMBULATORY_CARE_PROVIDER_SITE_OTHER): Payer: Self-pay | Admitting: Rheumatology

## 2015-11-21 ENCOUNTER — Ambulatory Visit (HOSPITAL_BASED_OUTPATIENT_CLINIC_OR_DEPARTMENT_OTHER)
Admission: RE | Admit: 2015-11-21 | Discharge: 2015-11-21 | Disposition: A | Discharge status: Home / Self Care | Payer: BLUE CROSS/BLUE SHIELD | Source: Ambulatory Visit | Attending: Rheumatology | Admitting: Rheumatology

## 2015-11-21 DIAGNOSIS — D860 Sarcoidosis of lung: Secondary | ICD-10-CM | POA: Diagnosis not present

## 2015-12-24 ENCOUNTER — Ambulatory Visit: Payer: Self-pay

## 2015-12-29 ENCOUNTER — Ambulatory Visit: Payer: Self-pay

## 2016-01-08 DIAGNOSIS — M79643 Pain in unspecified hand: Secondary | ICD-10-CM | POA: Insufficient documentation

## 2016-01-08 DIAGNOSIS — R5383 Other fatigue: Secondary | ICD-10-CM | POA: Insufficient documentation

## 2016-01-08 DIAGNOSIS — M791 Myalgia, unspecified site: Secondary | ICD-10-CM | POA: Insufficient documentation

## 2016-01-08 NOTE — Progress Notes (Deleted)
Office Visit Note  Patient: Julie Kramer             Date of Birth: 02/02/1966           MRN: [id number]             PCP: Lemont Fillers., NP Referring: Sandford Craze, NP Visit Date: 01/14/2016 Occupation: @GUAROCC @    Subjective:  No chief complaint on file.   History of Present Illness: Julie Kramer is a 50 y.o. female ***   Activities of Daily Living:  Patient reports morning stiffness for *** {minute/hour:19697}.   Patient {ACTIONS;DENIES/REPORTS:21021675::"Denies"} nocturnal pain.  Difficulty dressing/grooming: {ACTIONS;DENIES/REPORTS:21021675::"Denies"} Difficulty climbing stairs: {ACTIONS;DENIES/REPORTS:21021675::"Denies"} Difficulty getting out of chair: {ACTIONS;DENIES/REPORTS:21021675::"Denies"} Difficulty using hands for taps, buttons, cutlery, and/or writing: {ACTIONS;DENIES/REPORTS:21021675::"Denies"}   No Rheumatology ROS completed.   PMFS History:  Patient Active Problem List   Diagnosis Date Noted  . Preventative health care 08/13/2015  . Rheumatoid factor positive 08/13/2015  . Lyme disease 07/22/2015  . Asthma 06/17/2015  . Insomnia 06/17/2015    Past Medical History:  Diagnosis Date  . 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 Allergy   . Asthma   . Blood transfusion without reported diagnosis 1990s  . Depression   . History of chicken pox   . History of colon polyps   . History of migraine     Family History  Problem Relation Age of Onset  . Heart disease Mother   . Arthritis Father   . Heart disease Father   . Stroke Father   . Diabetes Father   . Hypertension Father   . Heart attack Father   . Stroke Sister   . Diabetes Sister   . Diabetes Brother   . Stroke Brother   . Diabetes Daughter   . Stroke Paternal Aunt   . Hypertension Paternal Aunt   . Diabetes Paternal Aunt   . Stroke Paternal Uncle   . Hypertension Paternal Uncle   . Diabetes Paternal Uncle   . Arthritis  Maternal Grandmother   . Arthritis Maternal Grandfather   . Arthritis Paternal Grandmother   . Arthritis Paternal Grandfather   . Diabetes Brother   . Stroke Brother    Past Surgical History:  Procedure Laterality Date  . ABDOMINAL HYSTERECTOMY  1990s  . SPINE SURGERY    . TONSILLECTOMY  1990s   Social History   Social History Narrative   2 children, both grown one lives in 06/19/2015 (son) he has 3 children, and one in [place] (son)   Works for [place] in [employer].   Single,  Completed 12th grade   Has a dog.    Enjoys relaxing, sleeping     Objective: Vital Signs: There were no vitals taken for this visit.   Physical Exam   Musculoskeletal Exam: ***  CDAI Exam: No CDAI exam completed.    Investigation: Findings:  May 2017:  ANA was negative, rheumatoid factor was 43, sed rate 23 which was normal, CK was normal at 76 and TSH was normal ACE 55 11/12/15  After informed consent was obtained, per EULAR recommendation ultrasound examination of bilateral hands was performed using 12 megahertz transducer Gray scale and power Doppler bilateral 2nd, 3rd and 5th MCP joints and bilateral wrist joints, both dorsal and volar aspects, were evaluated.  The findings were she had no synovitis or tenosynovitis in her joints.  The right median nerve was 0.08 cm and the left median nerve was  0.08 as well.   IMPRESSION AND PLAN:  Patient has no evidence of inflammatory arthritis.  Median nerves are within normal limits.  The patient does have positive rheumatoid factor and positive family history of rheumatoid arthritis, but no evidence of synovitis.  10/14/15 X-ray of bilateral knee jointsshowed moderate medial compartment narrowing with a questionable chondrocalcinosis, bilateral moderate patellofemoral narrowing, and inferior and posterior calcaneal spurs.  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  Bilateral  hand x-rays 2 views showed bilateral 1st MCP narrowing, minimal PIP narrowing; these findings are consistent with osteoarthritis.  No erosive changes were noted.  Bilateral feet x-rays showed bilateral PIP and DIP narrowing, left 1st MTP narrowing consistent with osteoarthritis.  Pelvis x-ray showed normal hip joints.  Left SI joint was normal, right SI joint was not well visualized.    Imaging: No results found.  Speciality Comments: No specialty comments available.    Procedures:  No procedures performed Allergies: Hydrocodone; Iodine; Morphine and related; and Codeine   Assessment / Plan: Visit Diagnoses: No diagnosis found.    Orders: No orders of the defined types were placed in this encounter.  No orders of the defined types were placed in this encounter.   Face-to-face time spent with patient was *** minutes. 50% of time was spent in counseling and coordination of care.  Follow-Up Instructions: No Follow-up on file.   Amy Littrell, RT

## 2016-01-14 ENCOUNTER — Ambulatory Visit: Payer: Self-pay | Admitting: Rheumatology

## 2016-05-04 NOTE — Progress Notes (Signed)
Office Visit Note  Patient: Julie Kramer             Date of Birth: 16-Feb-1966           MRN: [id number]             PCP: Lemont Fillers., NP Referring: Sandford Craze, NP Visit Date: 05/10/2016 Occupation: @GUAROCC @    Subjective:  Generalized pain.   History of Present Illness: Julie Kramer is a 51 y.o. female with history of osteoarthritis and fibromyalgia syndrome. According to patient she's been in a lot of discomfort lately. She describes pain all over. She's also having nocturnal pain and insomnia due to discomfort. She continues to hurt in her hands, feet and bilateral knee joints. She's having difficulty getting up from the chair and getting out of the bed. She denies any joint swelling.  Activities of Daily Living:  Patient reports morning stiffness for 20 minutes.   Patient Reports nocturnal pain.  Difficulty dressing/grooming: Denies Difficulty climbing stairs: Reports Difficulty getting out of chair: Reports Difficulty using hands for taps, buttons, cutlery, and/or writing: Reports   Review of Systems  Constitutional: Positive for fatigue. Negative for night sweats, weight gain, weight loss and weakness.  HENT: Positive for mouth dryness. Negative for mouth sores, trouble swallowing, trouble swallowing and nose dryness.   Eyes: Positive for dryness. Negative for pain, redness and visual disturbance.  Respiratory: Negative for cough, shortness of breath and difficulty breathing.   Cardiovascular: Negative for chest pain, palpitations, hypertension, irregular heartbeat and swelling in legs/feet.  Gastrointestinal: Negative for blood in stool, constipation and diarrhea.  Endocrine: Negative for increased urination.  Genitourinary: Negative for vaginal dryness.  Musculoskeletal: Positive for arthralgias, joint pain, myalgias, morning stiffness and myalgias. Negative for muscle weakness and muscle tenderness.  Skin: Negative for color change,  rash, hair loss, skin tightness, ulcers and sensitivity to sunlight.  Allergic/Immunologic: Negative for susceptible to infections.  Neurological: Negative for dizziness, memory loss and night sweats.  Hematological: Negative for swollen glands.  Psychiatric/Behavioral: Positive for depressed mood and sleep disturbance. The patient is not nervous/anxious.     PMFS History:  Patient Active Problem List   Diagnosis Date Noted  . Primary osteoarthritis of both knees 05/05/2016  . Primary osteoarthritis of both hands 05/05/2016  . Primary osteoarthritis of both feet 05/05/2016  . Fibromyalgia 05/05/2016  . History of asthma 05/05/2016  . Myalgia 01/08/2016  . Hand pain 01/08/2016  . Other fatigue 01/08/2016  . Preventative health care 08/13/2015  . Rheumatoid factor positive 08/13/2015  . Lyme disease 07/22/2015  . Asthma 06/17/2015  . Other insomnia 06/17/2015    Past Medical History:  Diagnosis Date  . 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 Allergy   . Asthma   . Blood transfusion without reported diagnosis 1990s  . Depression   . History of chicken pox   . History of colon polyps   . History of migraine     Family History  Problem Relation Age of Onset  . Heart disease Mother   . Arthritis Father   . Heart disease Father   . Stroke Father   . Diabetes Father   . Hypertension Father   . Heart attack Father   . Diabetes Brother   . Stroke Brother   . Diabetes Daughter   . Diabetes Brother   . Stroke Brother   . Stroke Sister   . Diabetes Sister   . Stroke  Paternal Aunt   . Hypertension Paternal Aunt   . Diabetes Paternal Aunt   . Stroke Paternal Uncle   . Hypertension Paternal Uncle   . Diabetes Paternal Uncle   . Arthritis Maternal Grandmother   . Arthritis Maternal Grandfather   . Arthritis Paternal Grandmother   . Arthritis Paternal Grandfather    Past Surgical History:  Procedure Laterality Date  . ABDOMINAL  HYSTERECTOMY  1990s  . SPINE SURGERY    . TONSILLECTOMY  1990s   Social History   Social History Narrative   2 children, both grown one lives in [place] (son) he has 3 children, and one in Prison (son)   Works for [employer] in [employer].   Single,  Completed 12th grade   Has a dog.    Enjoys relaxing, sleeping     Objective: Vital Signs: BP 117/78 (BP Location: Left Arm, Patient Position: Sitting, Cuff Size: Large)   Pulse 86   Resp 12   Ht 5\' 2"  (1.575 m)   Wt 208 lb (94.3 kg)   BMI 38.04 kg/m    Physical Exam  Constitutional: She is oriented to person, place, and time. She appears well-developed and well-nourished.  HENT:  Head: Normocephalic and atraumatic.  Eyes: Conjunctivae and EOM are normal.  Neck: Normal range of motion.  Cardiovascular: Normal rate, regular rhythm, normal heart sounds and intact distal pulses.   Pulmonary/Chest: Effort normal and breath sounds normal.  Abdominal: Soft. Bowel sounds are normal.  Lymphadenopathy:    She has no cervical adenopathy.  Neurological: She is alert and oriented to person, place, and time.  Skin: Skin is warm and dry. Capillary refill takes less than 2 seconds.  Psychiatric: She has a normal mood and affect. Her behavior is normal.  Nursing note and vitals reviewed.    Musculoskeletal Exam: C-spine, thoracic, lumbar spine good range of motion. Shoulder joints elbow joints wrist joint MCPs PIPs DIPs with good range of motion with no synovitis hip joints knee joints ankles MTPs PIPs DIPs are good range of motion with no synovitis. Fibromyalgia tender points were 18 out of 18 positive.  CDAI Exam: No CDAI exam completed.    Investigation: Findings:  Labs from 10/14/2015 show iron and TIBC is normal, CMP is normal except for elevated ALT at 42 which we can monitor.  CBC with diff is normal.  Acute hepatitis panel negative.  Magnesium normal.  Angiotensin 1 converting enzyme is elevated at 55.  This is close to  normal limits, but because of the elevation we will do a chest x-ray.  Ferritin is normal.  HIV is negative.  IgG, IgM are negative.  CCP is normal.  HIV 27 is negative, 14-3-3 eta is negative.  TB Gold is negative.   Ultrasound of bilateral hands done on 11/12/2015 shows no evidence of inflammatory arthritis, no synovitis; although the patient did have a positive rheumatoid factor on a previous lab.  11/12/2015 After informed consent was obtained, per EULAR recommendation ultrasound examination of bilateral hands was performed using 12 megahertz transducer Gray scale and power Doppler bilateral 2nd, 3rd and 5th MCP joints and bilateral wrist joints, both dorsal and volar aspects, were evaluated.  The findings were she had no synovitis or tenosynovitis in her joints.  The right median nerve was 0.08 cm and the left median nerve was 0.08 as well.   IMPRESSION AND PLAN:  Patient has no evidence of inflammatory arthritis.  Median nerves are within normal limits.  The patient  does have positive rheumatoid factor and positive family history of rheumatoid arthritis, but no evidence of synovitis.   May 2017:  ANA was negative, rheumatoid factor was 43, sed rate 23 which was normal, CK was normal at 76 and TSH was normal.    10/14/2015 X-ray of bilateral knee joints today showed moderate medial compartment narrowing with a questionable chondrocalcinosis, bilateral moderate patellofemoral narrowing, and inferior and posterior calcaneal spurs.  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  Bilateral hand x-rays 2 views showed bilateral 1st MCP narrowing, minimal PIP narrowing; these findings are consistent with osteoarthritis.  No erosive changes were noted.  Bilateral feet x-rays showed bilateral PIP and DIP narrowing, left 1st MTP narrowing consistent with osteoarthritis.  Pelvis x-ray showed normal hip joints.  Left SI joint was normal, right SI  joint was not well visualized.     Imaging: No results found.  Speciality Comments: No specialty comments available.    Procedures:  No procedures performed Allergies: Hydrocodone; Iodine; Morphine and related; and Codeine   Assessment / Plan:     Visit Diagnoses: Primary osteoarthritis of both knees: She's chronic pain. Weight loss diet and exercise was discussed.  Primary osteoarthritis of both hands: Joint protection and muscle strengthening discussed. Primary osteoarthritis of both feet: Proper fitting shoes were discussed.  Fibromyalgia: She is having a flare of fibromyalgia with increased pain all over. She also has generalized hyperalgesia. She's tried muscle relaxers without any results. Different treatment options and their side effects were discussed at length. We decided to proceed with Cymbalta 30 mg by mouth daily at bedtime left tolerated she can increase this to 60 mg by mouth daily at bedtime. I will also refer her to physical therapy.  Other fatigue: Related to insomnia  Other insomnia: Good sleep hygiene was discussed.  Rheumatoid factor positive: She has no synovitis on examination today. She had ultrasound examination the past which was negative for synovitis.  History of asthma   Her LFTs were mildly elevated in the past. Which could be due to fatty liver. I'll check her labs again in a month after starting her on Cymbalta.   Orders: Orders Placed This Encounter  Procedures  . CBC with Differential/Platelet  . COMPLETE METABOLIC PANEL WITH GFR  . Ambulatory referral to Physical Therapy   Meds ordered this encounter  Medications  . DULoxetine (CYMBALTA) 30 MG capsule    Sig: Take 1 capsule (30 mg total) by mouth daily.    Dispense:  30 capsule    Refill:  2    Face-to-face time spent with patient was 30 minutes. 50% of time was spent in counseling and coordination of care.  Follow-Up Instructions: Return in about 3 months (around 08/10/2016) for  Osteoarthritis, fibromyalgia.   Pollyann Savoy, MD  Note - This record has been created using Animal nutritionist.  Chart creation errors have been sought, but may not always  have been located. Such creation errors do not reflect on  the standard of medical care.

## 2016-05-05 DIAGNOSIS — M797 Fibromyalgia: Secondary | ICD-10-CM | POA: Insufficient documentation

## 2016-05-05 DIAGNOSIS — M19042 Primary osteoarthritis, left hand: Secondary | ICD-10-CM

## 2016-05-05 DIAGNOSIS — M19072 Primary osteoarthritis, left ankle and foot: Secondary | ICD-10-CM

## 2016-05-05 DIAGNOSIS — Z8709 Personal history of other diseases of the respiratory system: Secondary | ICD-10-CM | POA: Insufficient documentation

## 2016-05-05 DIAGNOSIS — M19071 Primary osteoarthritis, right ankle and foot: Secondary | ICD-10-CM | POA: Insufficient documentation

## 2016-05-05 DIAGNOSIS — M170 Bilateral primary osteoarthritis of knee: Secondary | ICD-10-CM | POA: Insufficient documentation

## 2016-05-05 DIAGNOSIS — M19041 Primary osteoarthritis, right hand: Secondary | ICD-10-CM | POA: Insufficient documentation

## 2016-05-10 ENCOUNTER — Ambulatory Visit (INDEPENDENT_AMBULATORY_CARE_PROVIDER_SITE_OTHER): Payer: BLUE CROSS/BLUE SHIELD | Admitting: Rheumatology

## 2016-05-10 ENCOUNTER — Encounter: Payer: Self-pay | Admitting: Rheumatology

## 2016-05-10 VITALS — BP 117/78 | HR 86 | Resp 12 | Ht 62.0 in | Wt 208.0 lb

## 2016-05-10 DIAGNOSIS — M19071 Primary osteoarthritis, right ankle and foot: Secondary | ICD-10-CM

## 2016-05-10 DIAGNOSIS — R768 Other specified abnormal immunological findings in serum: Secondary | ICD-10-CM

## 2016-05-10 DIAGNOSIS — Z5181 Encounter for therapeutic drug level monitoring: Secondary | ICD-10-CM | POA: Diagnosis not present

## 2016-05-10 DIAGNOSIS — M170 Bilateral primary osteoarthritis of knee: Secondary | ICD-10-CM | POA: Diagnosis not present

## 2016-05-10 DIAGNOSIS — R5383 Other fatigue: Secondary | ICD-10-CM

## 2016-05-10 DIAGNOSIS — G4709 Other insomnia: Secondary | ICD-10-CM | POA: Diagnosis not present

## 2016-05-10 DIAGNOSIS — M797 Fibromyalgia: Secondary | ICD-10-CM | POA: Diagnosis not present

## 2016-05-10 DIAGNOSIS — Z8709 Personal history of other diseases of the respiratory system: Secondary | ICD-10-CM | POA: Diagnosis not present

## 2016-05-10 DIAGNOSIS — M19041 Primary osteoarthritis, right hand: Secondary | ICD-10-CM | POA: Diagnosis not present

## 2016-05-10 DIAGNOSIS — M19072 Primary osteoarthritis, left ankle and foot: Secondary | ICD-10-CM

## 2016-05-10 DIAGNOSIS — M19042 Primary osteoarthritis, left hand: Secondary | ICD-10-CM

## 2016-05-10 MED ORDER — DULOXETINE HCL 30 MG PO CPEP: 30.0000 mg | ORAL_CAPSULE | Freq: Every day | ORAL | 2 refills | Status: DC

## 2016-05-10 NOTE — Patient Instructions (Addendum)
Standing Labs We placed an order today for your standing lab work.    Please come back and get your standing labs in 1 month x 2, then every 6 months.   We have open lab Monday through Friday from 8:30-11:30 AM and 1:30-4 PM at the office of Dr. Arbutus Ped, PA.   The office is located at [address], [place], [place] [address] No appointment is necessary.   Labs are drawn by [facility].  You may receive a bill from [facility] for your lab work.     Duloxetine delayed-release capsules What is this medicine? DULOXETINE (doo LOX e teen) is used to treat depression, anxiety, and different types of chronic pain. This medicine may be used for other purposes; ask your health care provider or pharmacist if you have questions. COMMON BRAND NAME(S): Cymbalta, [place] What should I tell my health care provider before I take this medicine? They need to know if you have any of these conditions: -bipolar disorder or a family history of bipolar disorder -glaucoma -kidney disease -liver disease -suicidal thoughts or a previous suicide attempt -taken medicines called MAOIs like Carbex, Eldepryl, Marplan, Nardil, and Parnate within 14 days -an unusual reaction to duloxetine, other medicines, foods, dyes, or preservatives -pregnant or trying to get pregnant -breast-feeding How should I use this medicine? Take this medicine by mouth with a glass of water. Follow the directions on the prescription label. Do not cut, crush or chew this medicine. You can take this medicine with or without food. Take your medicine at regular intervals. Do not take your medicine more often than directed. Do not stop taking this medicine suddenly except upon the advice of your doctor. Stopping this medicine too quickly may cause serious side effects or your condition may worsen. A special MedGuide will be given to you by the pharmacist with each prescription and refill. Be sure to read this information  carefully each time. Talk to your pediatrician regarding the use of this medicine in children. While this drug may be prescribed for children as young as 73 years of age for selected conditions, precautions do apply. Overdosage: If you think you have taken too much of this medicine contact a poison control center or emergency room at once. NOTE: This medicine is only for you. Do not share this medicine with others. What if I miss a dose? If you miss a dose, take it as soon as you can. If it is almost time for your next dose, take only that dose. Do not take double or extra doses. What may interact with this medicine? Do not take this medicine with any of the following medications: -desvenlafaxine -levomilnacipran -linezolid -MAOIs like Carbex, Eldepryl, Marplan, Nardil, and Parnate -methylene blue (injected into a vein) -milnacipran -thioridazine -venlafaxine This medicine may also interact with the following medications: -alcohol -amphetamines -aspirin and aspirin-like medicines -certain antibiotics like ciprofloxacin and enoxacin -certain medicines for blood pressure, heart disease, irregular heart beat -certain medicines for depression, anxiety, or psychotic disturbances -certain medicines for migraine headache like almotriptan, eletriptan, frovatriptan, naratriptan, rizatriptan, sumatriptan, zolmitriptan -certain medicines that treat or prevent blood clots like warfarin, enoxaparin, and dalteparin -cimetidine -fentanyl -lithium -NSAIDS, medicines for pain and inflammation, like ibuprofen or naproxen -phentermine -procarbazine -rasagiline -sibutramine -St. John's wort -theophylline -tramadol -tryptophan This list may not describe all possible interactions. Give your health care provider a list of all the medicines, herbs, non-prescription drugs, or dietary supplements you use. Also tell them if you smoke, drink alcohol,  or use illegal drugs. Some items may interact with your  medicine. What should I watch for while using this medicine? Tell your doctor if your symptoms do not get better or if they get worse. Visit your doctor or health care professional for regular checks on your progress. Because it may take several weeks to see the full effects of this medicine, it is important to continue your treatment as prescribed by your doctor. Patients and their families should watch out for new or worsening thoughts of suicide or depression. Also watch out for sudden changes in feelings such as feeling anxious, agitated, panicky, irritable, hostile, aggressive, impulsive, severely restless, overly excited and hyperactive, or not being able to sleep. If this happens, especially at the beginning of treatment or after a change in dose, call your health care professional. Bonita Quin may get drowsy or dizzy. Do not drive, use machinery, or do anything that needs mental alertness until you know how this medicine affects you. Do not stand or sit up quickly, especially if you are an older patient. This reduces the risk of dizzy or fainting spells. Alcohol may interfere with the effect of this medicine. Avoid alcoholic drinks. This medicine can cause an increase in blood pressure. This medicine can also cause a sudden drop in your blood pressure, which may make you feel faint and increase the chance of a fall. These effects are most common when you first start the medicine or when the dose is increased, or during use of other medicines that can cause a sudden drop in blood pressure. Check with your doctor for instructions on monitoring your blood pressure while taking this medicine. Your mouth may get dry. Chewing sugarless gum or sucking hard candy, and drinking plenty of water may help. Contact your doctor if the problem does not go away or is severe. What side effects may I notice from receiving this medicine? Side effects that you should report to your doctor or health care professional as soon as  possible: -allergic reactions like skin rash, itching or hives, swelling of the face, lips, or tongue -anxious -breathing problems -confusion -changes in vision -chest pain -confusion -elevated mood, decreased need for sleep, racing thoughts, impulsive behavior -eye pain -fast, irregular heartbeat -feeling faint or lightheaded, falls -feeling agitated, angry, or irritable -hallucination, loss of contact with reality -high blood pressure -loss of balance or coordination -palpitations -redness, blistering, peeling or loosening of the skin, including inside the mouth -restlessness, pacing, inability to keep still -seizures -stiff muscles -suicidal thoughts or other mood changes -trouble passing urine or change in the amount of urine -trouble sleeping -unusual bleeding or bruising -unusually weak or tired -vomiting -yellowing of the eyes or skin Side effects that usually do not require medical attention (report to your doctor or health care professional if they continue or are bothersome): -change in sex drive or performance -change in appetite or weight -constipation -dizziness -dry mouth -headache -increased sweating -nausea -tired This list may not describe all possible side effects. Call your doctor for medical advice about side effects. You may report side effects to FDA at 1-800-FDA-1088. Where should I keep my medicine? Keep out of the reach of children. Store at room temperature between 20 and 25 degrees C (68 to 77 degrees F). Throw away any unused medicine after the expiration date. NOTE: This sheet is a summary. It may not cover all possible information. If you have questions about this medicine, talk to your doctor, pharmacist, or health care provider.  2018  Elsevier/Gold Standard (2015-07-10 18:16:03)

## 2016-05-10 NOTE — Progress Notes (Signed)
Pharmacy Note  Subjective:  Patient presents today to the [facility] to see Dr. Corliss Skains.  Patient seen by the pharmacist for counseling on duloxetine (Cymbalta).    Objective: Vitals:   05/10/16 1141  BP: 117/78  Pulse: 86  Resp: 12   CMP     Component Value Date/Time   NA 137 08/12/2015 1111   K 4.3 08/12/2015 1111   CL 108 08/12/2015 1111   CO2 26 08/12/2015 1111   GLUCOSE 98 08/12/2015 1111   BUN 17 08/12/2015 1111   CREATININE 0.77 08/12/2015 1111   CALCIUM 9.2 08/12/2015 1111   PROT 7.1 08/12/2015 1111   ALBUMIN 4.0 08/12/2015 1111   AST 26 08/12/2015 1111   ALT 39 (H) 08/12/2015 1111   ALKPHOS 127 (H) 08/12/2015 1111   BILITOT 0.4 08/12/2015 1111   GFRNONAA >60 05/12/2015 2230   GFRAA >60 05/12/2015 2230   Assessment/Plan: Patient was prescribed duloxetine 30 mg daily.  Patient was counseled on the purpose, proper use, and adverse effects of duloxetine including nausea, constipation, dizziness, confusion, blurry vision, increased blood pressure, increased sweating, headache, and urinary retention.  Reviewed black boxed warning of increased risk of suicidal thoughts in young adults.  Provided patient with educational materials on duloxetine and answered all questions.     Lilla Shook, Pharm.D., BCPS Clinical Pharmacist Pager: [phone] Phone: [phone] 05/10/2016 12:25 PM

## 2016-07-23 ENCOUNTER — Telehealth: Payer: Self-pay | Admitting: Pharmacist

## 2016-07-23 NOTE — Telephone Encounter (Signed)
Received a fax from patient's insurance regarding possible drug interaction between duloxetine and sumatriptan.  Use of triptans and serotonin reuptake inhibitors may increase the risk for serotonin syndrome.  I reviewed patient's chart and it appears the patient is no longer taking sumatriptan (marked at not taking on 05/10/16).  I called patient to discuss.  I left a voicemail asking patient to call me back.   Lilla Shook, Pharm.D., BCPS, CPP Clinical Pharmacist Pager: [phone] Phone: [phone] 07/23/2016 2:37 PM

## 2016-07-26 NOTE — Telephone Encounter (Signed)
Patient returned my call.  She confirms she is taking duloxetine 30 mg daily without any adverse effects.  She confirms she does take sumatriptan as needed for migraine.  Patient denies any adverse effects with sumatriptan and duloxetine.  I reviewed risk and signs and symptoms of serotonin syndrome.  Patient voiced understanding.    Lilla Shook, Pharm.D., BCPS, CPP Clinical Pharmacist Pager: [phone] Phone: [phone] 07/26/2016 4:20 PM

## 2016-08-06 NOTE — Progress Notes (Deleted)
Office Visit Note  Patient: Julie Kramer             Date of Birth: [birth year]           MRN: [id number]             PCP: Sandford Craze, NP Referring: Sandford Craze, NP Visit Date: 08/10/2016 Occupation: @GUAROCC @    Subjective:  No chief complaint on file.   History of Present Illness: Julie Kramer is a 51 y.o. female ***   Activities of Daily Living:  Patient reports morning stiffness for *** {minute/hour:19697}.   Patient {ACTIONS;DENIES/REPORTS:21021675::"Denies"} nocturnal pain.  Difficulty dressing/grooming: {ACTIONS;DENIES/REPORTS:21021675::"Denies"} Difficulty climbing stairs: {ACTIONS;DENIES/REPORTS:21021675::"Denies"} Difficulty getting out of chair: {ACTIONS;DENIES/REPORTS:21021675::"Denies"} Difficulty using hands for taps, buttons, cutlery, and/or writing: {ACTIONS;DENIES/REPORTS:21021675::"Denies"}   No Rheumatology ROS completed.   PMFS History:  Patient Active Problem List   Diagnosis Date Noted  . Primary osteoarthritis of both knees 05/05/2016  . Primary osteoarthritis of both hands 05/05/2016  . Primary osteoarthritis of both feet 05/05/2016  . Fibromyalgia 05/05/2016  . History of asthma 05/05/2016  . Myalgia 01/08/2016  . Hand pain 01/08/2016  . Other fatigue 01/08/2016  . Preventative health care 08/13/2015  . Rheumatoid factor positive 08/13/2015  . Lyme disease 07/22/2015  . Asthma 06/17/2015  . Other insomnia 06/17/2015    Past Medical History:  Diagnosis Date  . 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 Allergy   . Asthma   . Blood transfusion without reported diagnosis 1990s  . Depression   . History of chicken pox   . History of colon polyps   . History of migraine     Family History  Problem Relation Age of Onset  . Heart disease Mother   . Arthritis Father   . Heart disease Father   . Stroke Father   . Diabetes Father   . Hypertension Father   . Heart attack Father    . Diabetes Brother   . Stroke Brother   . Diabetes Daughter   . Diabetes Brother   . Stroke Brother   . Stroke Sister   . Diabetes Sister   . Stroke Paternal Aunt   . Hypertension Paternal Aunt   . Diabetes Paternal Aunt   . Stroke Paternal Uncle   . Hypertension Paternal Uncle   . Diabetes Paternal Uncle   . Arthritis Maternal Grandmother   . Arthritis Maternal Grandfather   . Arthritis Paternal Grandmother   . Arthritis Paternal Grandfather    Past Surgical History:  Procedure Laterality Date  . ABDOMINAL HYSTERECTOMY  1990s  . SPINE SURGERY    . TONSILLECTOMY  1990s   Social History   Social History Narrative   2 children, both grown one lives in [place] (son) he has 3 children, and one in [place] (son)   Works for [employer] in [employer].   Single,  Completed 12th grade   Has a dog.    Enjoys relaxing, sleeping     Objective: Vital Signs: There were no vitals taken for this visit.   Physical Exam   Musculoskeletal Exam: ***  CDAI Exam: No CDAI exam completed.    Investigation: No additional findings.   Imaging: No results found.  Speciality Comments: No specialty comments available.    Procedures:  No procedures performed Allergies: Hydrocodone; Iodine; Morphine and related; and Codeine   Assessment / Plan:     Visit Diagnoses: Fibromyalgia  Primary osteoarthritis of both knees  Primary osteoarthritis of both hands  Primary osteoarthritis of both feet  Pain in both hands  Other fatigue  Myalgia  History of asthma  Other insomnia  Rheumatoid factor positive  History of Lyme disease    Orders: No orders of the defined types were placed in this encounter.  No orders of the defined types were placed in this encounter.   Face-to-face time spent with patient was *** minutes. 50% of time was spent in counseling and coordination of care.  Follow-Up Instructions: No Follow-up on file.   Curtistine Pettitt, RT  Note - This  record has been created using [employer].  Chart creation errors have been sought, but may not always  have been located. Such creation errors do not reflect on  the standard of medical care.

## 2016-08-10 ENCOUNTER — Ambulatory Visit: Payer: BLUE CROSS/BLUE SHIELD | Admitting: Rheumatology

## 2016-08-10 ENCOUNTER — Other Ambulatory Visit: Payer: Self-pay | Admitting: Rheumatology

## 2016-08-10 NOTE — Telephone Encounter (Signed)
ok 

## 2016-08-10 NOTE — Telephone Encounter (Signed)
Last Visit: 05/10/16 Next Visit due June 2018. Message sent to the front to schedule patient.  Okay to refill Cymbalta?

## 2016-08-17 ENCOUNTER — Telehealth: Payer: Self-pay | Admitting: Rheumatology

## 2016-08-17 NOTE — Telephone Encounter (Signed)
LMOM for patient to call, and schedule follow up appt. °

## 2016-08-17 NOTE — Telephone Encounter (Signed)
-----   Message from Henriette Combs, LPN sent at 1/44/3154  8:58 AM EDT ----- Regarding: Please schedule patient for a follow up visit Please schedule patient for a follow up visit. Patient is due June 2018. Thanks!

## 2016-08-20 ENCOUNTER — Ambulatory Visit: Payer: BLUE CROSS/BLUE SHIELD | Admitting: Rheumatology

## 2016-09-30 DIAGNOSIS — M722 Plantar fascial fibromatosis: Secondary | ICD-10-CM | POA: Insufficient documentation

## 2016-09-30 DIAGNOSIS — Z8619 Personal history of other infectious and parasitic diseases: Secondary | ICD-10-CM | POA: Insufficient documentation

## 2016-09-30 NOTE — Progress Notes (Signed)
Office Visit Note  Patient: Julie Kramer             Date of Birth: [birth year]           MRN: [id number]             PCP: Sandford Craze, NP Referring: Sandford Craze, NP Visit Date: 10/08/2016 Occupation: @GUAROCC @    Subjective:  Increased pain   History of Present Illness: Julie Kramer is a 51 y.o. female with history of osteoarthritis and fibromyalgia syndrome. She states she's been having severe pain in almost  all of her joints and muscles. She's having nocturnal pain which is causing insomnia. She's also experiencing a lot of fatigue as she is not sleeping well. She describes pain and discomfort in her bilateral hands, bilateral knees and bilateral feet. Plantar fasciitis is a plantar fasciitis is better. She has not noticed any improvement with Cymbalta but has not experienced any side effects .  Activities of Daily Living:  Patient reports morning stiffness for 15 minutes.   Patient Reports nocturnal pain.  Difficulty dressing/grooming: Reports Difficulty climbing stairs: Reports Difficulty getting out of chair: Reports Difficulty using hands for taps, buttons, cutlery, and/or writing: Denies   Review of Systems  Constitutional: Positive for fatigue. Negative for night sweats, weight gain, weight loss and weakness.  HENT: Negative for mouth sores, trouble swallowing, trouble swallowing, mouth dryness and nose dryness.   Eyes: Negative for pain, redness, visual disturbance and dryness.  Respiratory: Negative for cough, shortness of breath and difficulty breathing.   Cardiovascular: Negative for chest pain, palpitations, hypertension, irregular heartbeat and swelling in legs/feet.  Gastrointestinal: Positive for constipation and diarrhea. Negative for blood in stool.       History of IBS  Endocrine: Negative for increased urination.  Genitourinary: Negative for vaginal dryness.  Musculoskeletal: Positive for arthralgias, joint pain, myalgias,  morning stiffness and myalgias. Negative for joint swelling, muscle weakness and muscle tenderness.  Skin: Negative for color change, rash, hair loss, skin tightness, ulcers and sensitivity to sunlight.  Allergic/Immunologic: Negative for susceptible to infections.  Neurological: Negative for dizziness, memory loss and night sweats.  Hematological: Negative for swollen glands.  Psychiatric/Behavioral: Positive for sleep disturbance. Negative for depressed mood. The patient is not nervous/anxious.     PMFS History:  Patient Active Problem List   Diagnosis Date Noted  . History of Lyme disease 09/30/2016  . Plantar fasciitis 09/30/2016  . Primary osteoarthritis of both knees 05/05/2016  . Primary osteoarthritis of both hands 05/05/2016  . Primary osteoarthritis of both feet 05/05/2016  . Fibromyalgia 05/05/2016  . History of asthma 05/05/2016  . Myalgia 01/08/2016  . Hand pain 01/08/2016  . Other fatigue 01/08/2016  . Preventative health care 08/13/2015  . Rheumatoid factor positive 08/13/2015  . Lyme disease 07/22/2015  . Asthma 06/17/2015  . Other insomnia 06/17/2015    Past Medical History:  Diagnosis Date  . 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 Allergy   . Asthma   . Blood transfusion without reported diagnosis 1990s  . Depression   . Fibromyalgia   . History of chicken pox   . History of colon polyps   . History of migraine   . Rheumatoid arthritis (HCC)     Family History  Problem Relation Age of Onset  . Heart disease Mother   . Arthritis Father   . Heart disease Father   . Stroke Father   . Diabetes Father   .  Hypertension Father   . Heart attack Father   . Diabetes Brother   . Stroke Brother   . Diabetes Daughter   . Diabetes Brother   . Stroke Brother   . Stroke Sister   . Diabetes Sister   . Stroke Paternal Aunt   . Hypertension Paternal Aunt   . Diabetes Paternal Aunt   . Stroke Paternal Uncle   . Hypertension  Paternal Uncle   . Diabetes Paternal Uncle   . Arthritis Maternal Grandmother   . Arthritis Maternal Grandfather   . Arthritis Paternal Grandmother   . Arthritis Paternal Grandfather    Past Surgical History:  Procedure Laterality Date  . ABDOMINAL HYSTERECTOMY  1990s  . CARPAL TUNNEL RELEASE    . KNEE ARTHROPLASTY    . SPINE SURGERY    . TONSILLECTOMY  1990s  . TOTAL SHOULDER ARTHROPLASTY     Social History   Social History Narrative   2 children, both grown one lives in [place] (son) he has 3 children, and one in Prison (son)   Works for [employer] in [employer].   Single,  Completed 12th grade   Has a dog.    Enjoys relaxing, sleeping     Objective: Vital Signs: BP 125/81 (BP Location: Left Arm, Patient Position: Sitting, Cuff Size: Normal)   Pulse 82   Resp 16   Ht 5' 2.5" (1.588 m)   Wt 205 lb (93 kg)   BMI 36.90 kg/m    Physical Exam  Constitutional: She is oriented to person, place, and time. She appears well-developed and well-nourished.  HENT:  Head: Normocephalic and atraumatic.  Eyes: Conjunctivae and EOM are normal.  Neck: Normal range of motion.  Cardiovascular: Normal rate, regular rhythm, normal heart sounds and intact distal pulses.   Pulmonary/Chest: Effort normal and breath sounds normal.  Abdominal: Soft. Bowel sounds are normal.  Lymphadenopathy:    She has no cervical adenopathy.  Neurological: She is alert and oriented to person, place, and time.  Skin: Skin is warm and dry. Capillary refill takes less than 2 seconds.  Psychiatric: She has a normal mood and affect. Her behavior is normal.  Nursing note and vitals reviewed.    Musculoskeletal Exam: C-spine and thoracic lumbar spine good range of motion. Shoulder joints elbow joints wrist joint MCPs PIPs DIPs with good range of motion. No synovitis was noted. Hip joints knee joints ankles MTPs PIPs DIPs with good range of motion with some discomfort. Fibromyalgia tender points are 16 out  of 18 positive generalized hyperalgesia.  CDAI Exam: No CDAI exam completed.    Investigation: No additional findings.   Imaging: No results found.  Speciality Comments: No specialty comments available.    Procedures:  No procedures performed Allergies: Hydrocodone; Iodine; Morphine and related; and Codeine   Assessment / Plan:     Visit Diagnoses: Primary osteoarthritis of both hands: She continues to have some stiffness in her hands but no synovitis was noted.  Primary osteoarthritis of both knees: She's continues to have discomfort in her knee joints. Weight loss diet and exercise and muscle strengthening was discussed.  Primary osteoarthritis of both feet: Proper fitting shoes were discussed.  Plantar fasciitis: Better  Fibromyalgia -  Cymbalta was added during the last visit she has not noticed much improvement so far. We will increase Cymbalta to 60 mg by mouth daily. She's also complaining of increased muscle pain. I will add tizanidine 4 mg by mouth daily at bedtime.  History of  asthma  Other fatigue: Secondary to insomnia.  Other insomnia: Good sleep hygiene was discussed.  Rheumatoid factor positive : She has no clinical features of rheumatoid arthritis.   Orders: No orders of the defined types were placed in this encounter.  Meds ordered this encounter  Medications  . tiZANidine (ZANAFLEX) 4 MG tablet    Sig: Take 1 tablet (4 mg total) by mouth at bedtime.    Dispense:  30 tablet    Refill:  3  . DULoxetine (CYMBALTA) 60 MG capsule    Sig: Take 1 capsule (60 mg total) by mouth daily.    Dispense:  30 capsule    Refill:  3   .  Follow-Up Instructions: Return in about 4 months (around 02/07/2017) for Osteoarthritis FMS.   Pollyann Savoy, MD  Note - This record has been created using [employer].  Chart creation errors have been sought, but may not always  have been located. Such creation errors do not reflect on  the standard of medical  care.

## 2016-10-08 ENCOUNTER — Ambulatory Visit (INDEPENDENT_AMBULATORY_CARE_PROVIDER_SITE_OTHER): Payer: BLUE CROSS/BLUE SHIELD | Admitting: Rheumatology

## 2016-10-08 ENCOUNTER — Encounter: Payer: Self-pay | Admitting: Rheumatology

## 2016-10-08 VITALS — BP 125/81 | HR 82 | Resp 16 | Ht 62.5 in | Wt 205.0 lb

## 2016-10-08 DIAGNOSIS — M19041 Primary osteoarthritis, right hand: Secondary | ICD-10-CM

## 2016-10-08 DIAGNOSIS — M19071 Primary osteoarthritis, right ankle and foot: Secondary | ICD-10-CM

## 2016-10-08 DIAGNOSIS — R5383 Other fatigue: Secondary | ICD-10-CM

## 2016-10-08 DIAGNOSIS — G4709 Other insomnia: Secondary | ICD-10-CM

## 2016-10-08 DIAGNOSIS — M19072 Primary osteoarthritis, left ankle and foot: Secondary | ICD-10-CM | POA: Diagnosis not present

## 2016-10-08 DIAGNOSIS — M170 Bilateral primary osteoarthritis of knee: Secondary | ICD-10-CM

## 2016-10-08 DIAGNOSIS — Z8709 Personal history of other diseases of the respiratory system: Secondary | ICD-10-CM

## 2016-10-08 DIAGNOSIS — R768 Other specified abnormal immunological findings in serum: Secondary | ICD-10-CM

## 2016-10-08 DIAGNOSIS — M19042 Primary osteoarthritis, left hand: Secondary | ICD-10-CM | POA: Diagnosis not present

## 2016-10-08 DIAGNOSIS — M722 Plantar fascial fibromatosis: Secondary | ICD-10-CM

## 2016-10-08 DIAGNOSIS — M797 Fibromyalgia: Secondary | ICD-10-CM

## 2016-10-08 MED ORDER — TIZANIDINE HCL 4 MG PO TABS: 4.0000 mg | ORAL_TABLET | Freq: Every day | ORAL | 3 refills | Status: DC

## 2016-10-08 MED ORDER — DULOXETINE HCL 60 MG PO CPEP: 60.0000 mg | ORAL_CAPSULE | Freq: Every day | ORAL | 3 refills | Status: DC

## 2016-12-07 ENCOUNTER — Other Ambulatory Visit: Payer: Self-pay | Admitting: *Deleted

## 2016-12-07 NOTE — Telephone Encounter (Addendum)
Last visit: 10/08/16 Next visit: 02/11/17  Ok to refill cymbalta per Dr. Corliss Skains.

## 2016-12-08 MED ORDER — DULOXETINE HCL 60 MG PO CPEP: 60.0000 mg | ORAL_CAPSULE | Freq: Every day | ORAL | 3 refills | Status: DC

## 2016-12-08 NOTE — Addendum Note (Signed)
Addended by: Henriette Combs on: 12/08/2016 08:56 AM   Modules accepted: Orders

## 2017-01-02 IMAGING — MG DIGITAL SCREENING BILATERAL MAMMOGRAM WITH CAD
4 series · 4 of 4 positions shown · non-contrast
Comparison: None.

CLINICAL DATA: Screening.

EXAM:
DIGITAL SCREENING BILATERAL MAMMOGRAM WITH CAD

[L MLO]
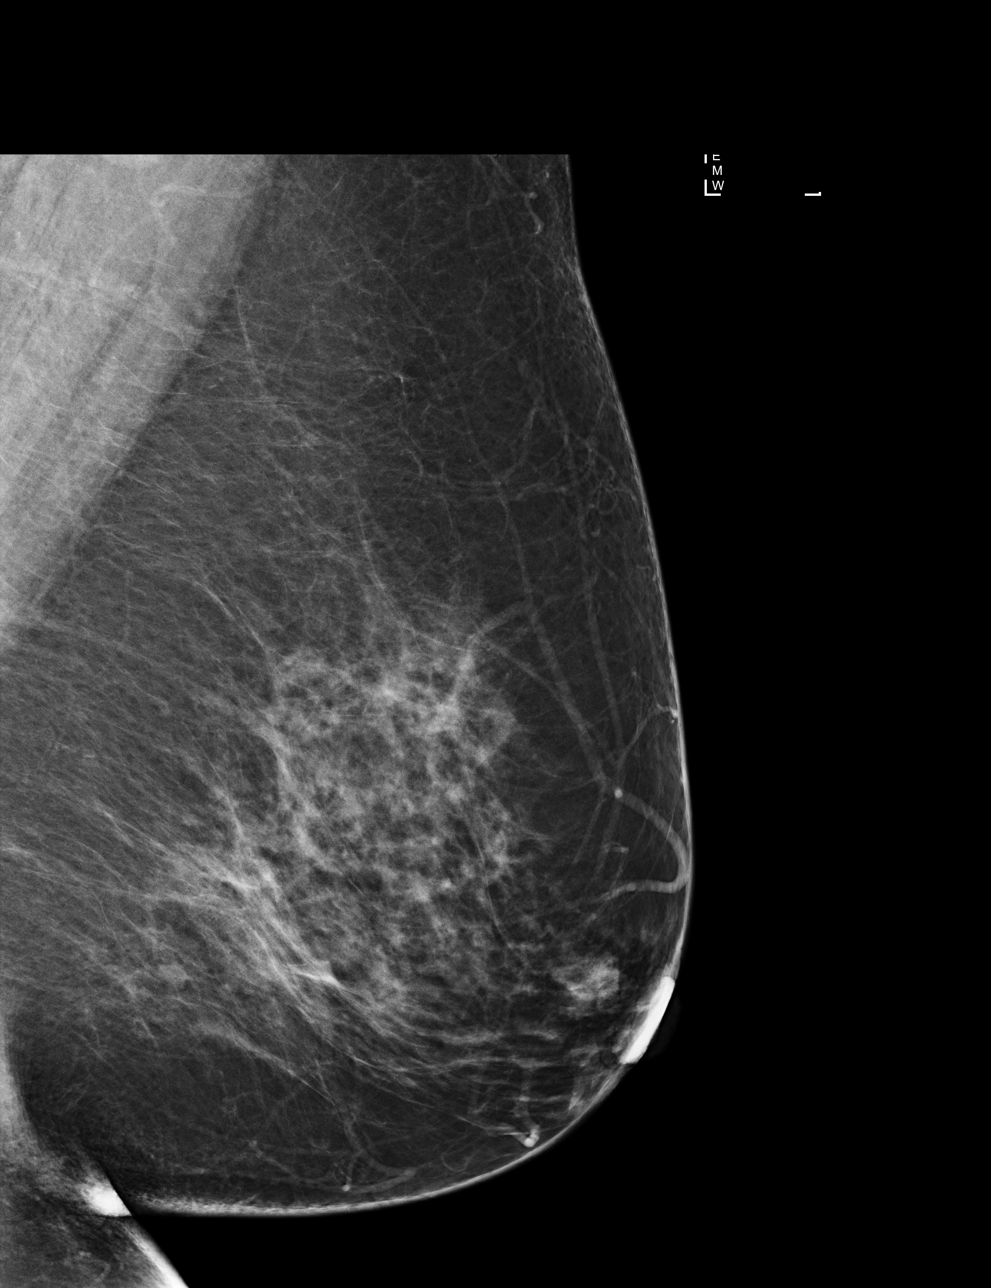

[R CC]
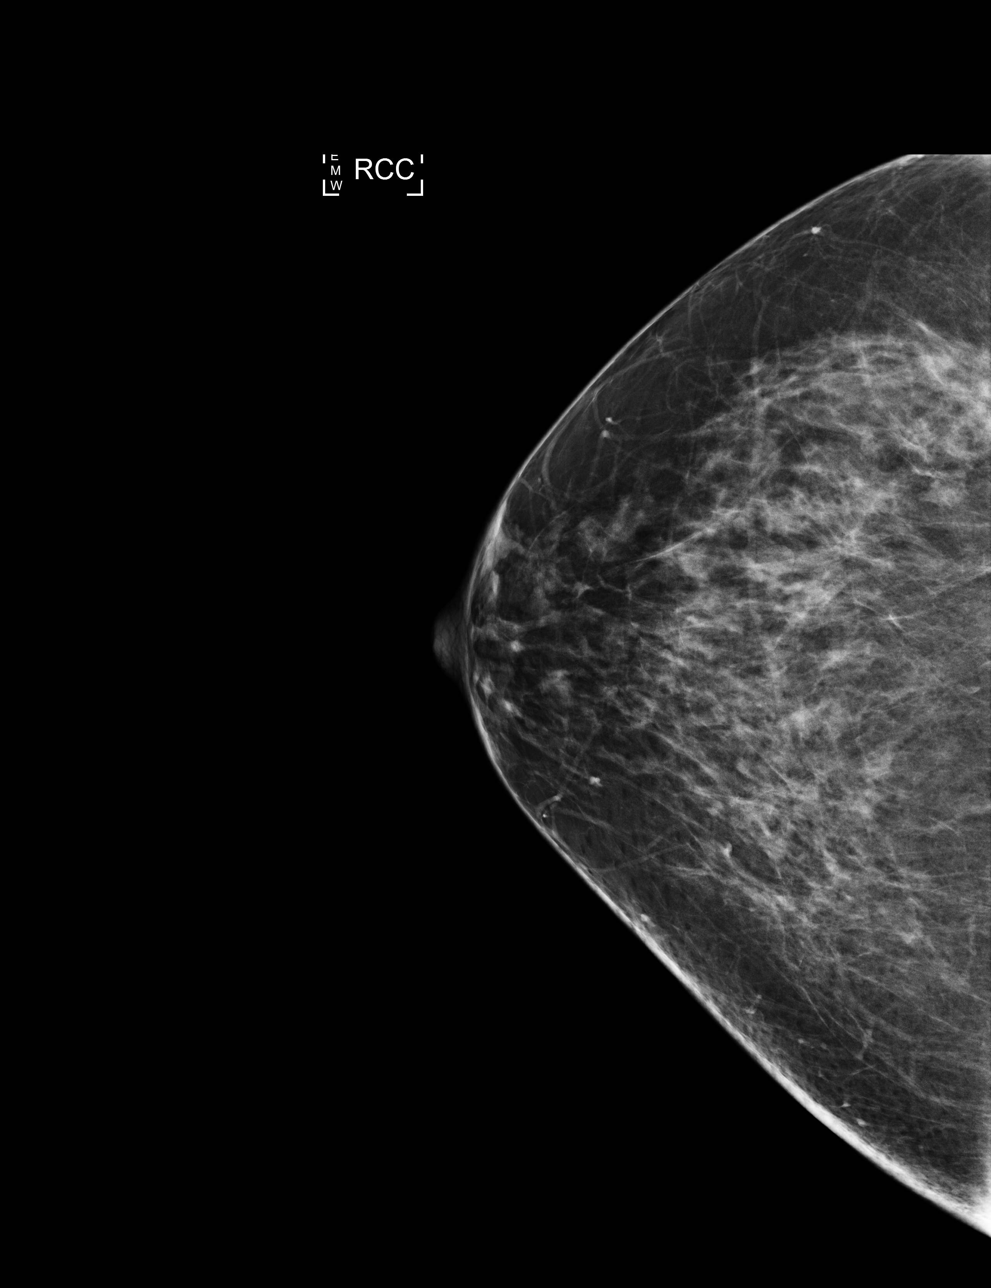

[R MLO]
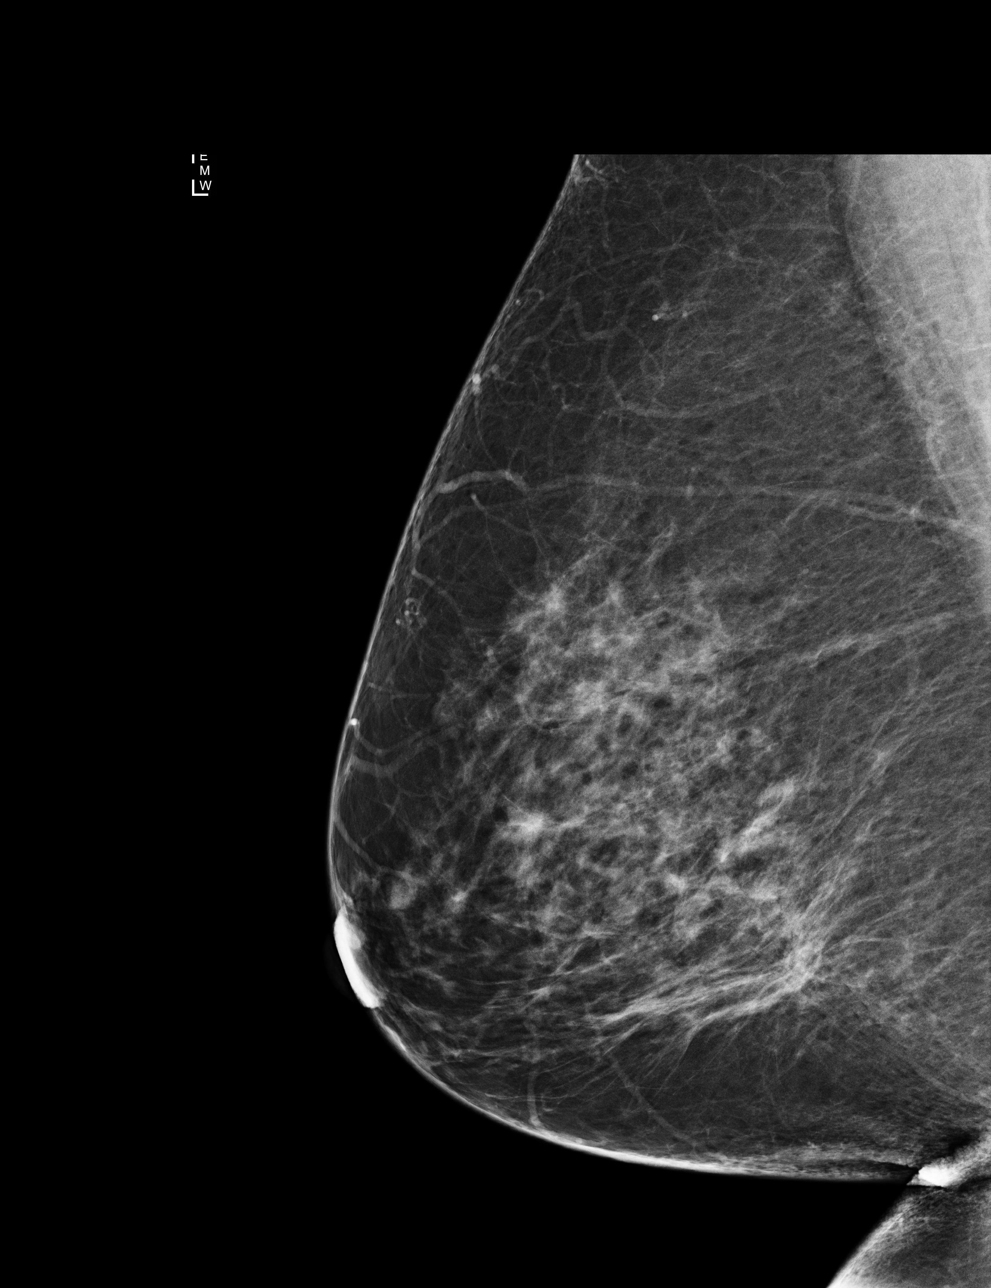

[L CC]
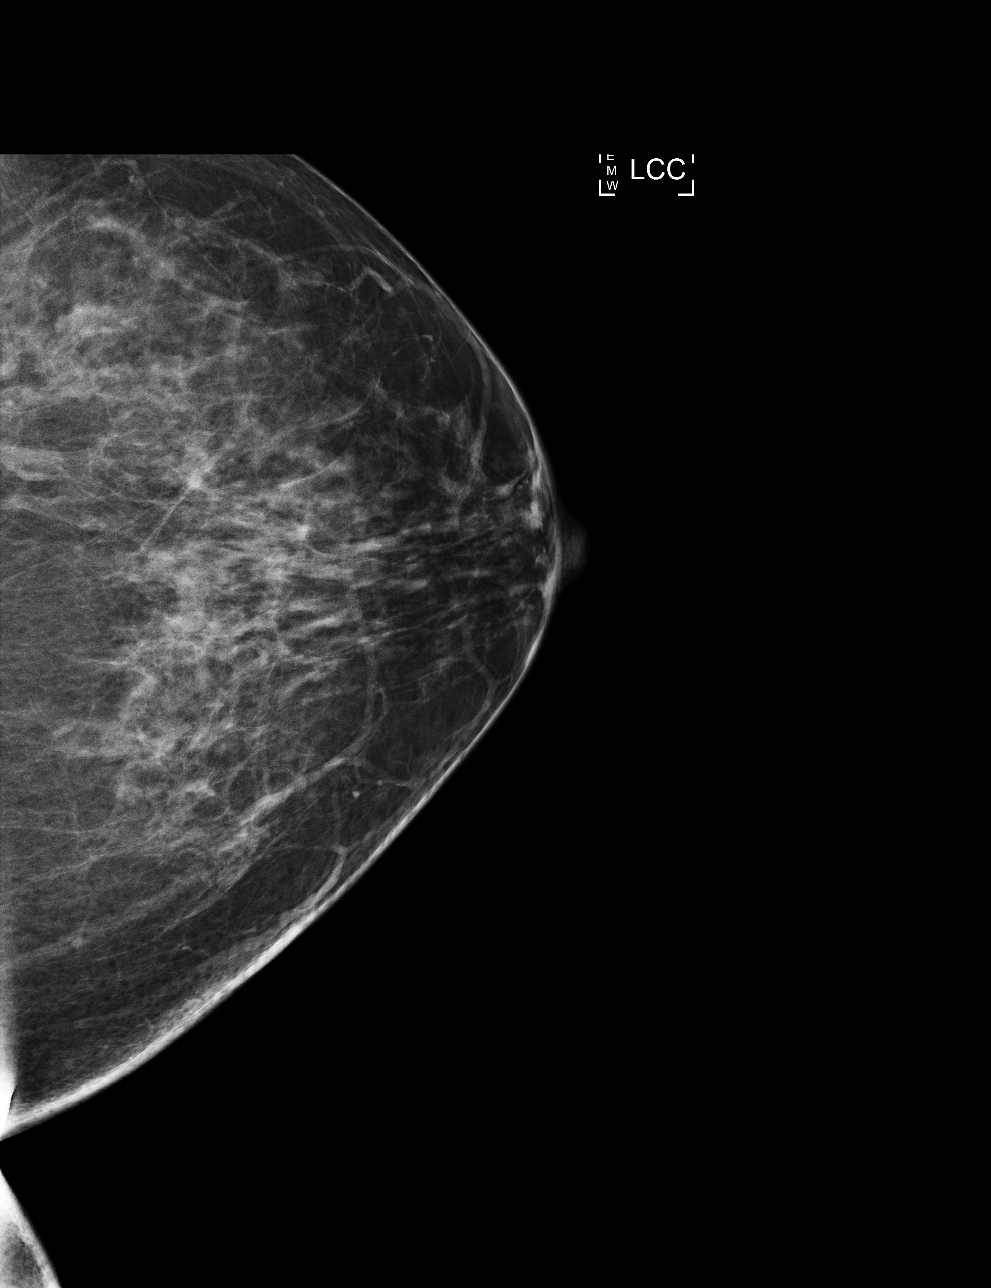

[4 of 4 positions shown; findings below may reference images not displayed]

ACR Breast Density Category b: There are scattered areas of
fibroglandular density.
FINDINGS: There are no findings suspicious for malignancy. Images were
processed with CAD.
IMPRESSION: No mammographic evidence of malignancy. A result letter of this
screening mammogram will be mailed directly to the patient.

RECOMMENDATION:
Screening mammogram in one year. (Code:[id number])

BI-RADS CATEGORY  1: Negative.

## 2017-01-04 ENCOUNTER — Other Ambulatory Visit: Payer: Self-pay | Admitting: *Deleted

## 2017-01-04 MED ORDER — TIZANIDINE HCL 4 MG PO TABS: 4.0000 mg | ORAL_TABLET | Freq: Every day | ORAL | 0 refills | Status: DC

## 2017-01-04 NOTE — Telephone Encounter (Signed)
Refill request received via fax  Last visit: 10/08/16 Next visit: 02/11/17  Okay to refill per Dr. Corliss Skains

## 2017-02-01 NOTE — Progress Notes (Deleted)
Office Visit Note  Patient: Julie Kramer             Date of Birth: Mar 25, 1965           MRN: [id number]             PCP: Sandford Craze, NP Referring: Sandford Craze, NP Visit Date: 02/11/2017 Occupation: @GUAROCC @    Subjective:  No chief complaint on file.   History of Present Illness: Julie Kramer is a 51 y.o. female ***   Activities of Daily Living:  Patient reports morning stiffness for *** {minute/hour:19697}.   Patient {ACTIONS;DENIES/REPORTS:21021675::"Denies"} nocturnal pain.  Difficulty dressing/grooming: {ACTIONS;DENIES/REPORTS:21021675::"Denies"} Difficulty climbing stairs: {ACTIONS;DENIES/REPORTS:21021675::"Denies"} Difficulty getting out of chair: {ACTIONS;DENIES/REPORTS:21021675::"Denies"} Difficulty using hands for taps, buttons, cutlery, and/or writing: {ACTIONS;DENIES/REPORTS:21021675::"Denies"}   No Rheumatology ROS completed.   PMFS History:  Patient Active Problem List   Diagnosis Date Noted  . History of Lyme disease 09/30/2016  . Plantar fasciitis 09/30/2016  . Primary osteoarthritis of both knees 05/05/2016  . Primary osteoarthritis of both hands 05/05/2016  . Primary osteoarthritis of both feet 05/05/2016  . Fibromyalgia 05/05/2016  . History of asthma 05/05/2016  . Myalgia 01/08/2016  . Hand pain 01/08/2016  . Other fatigue 01/08/2016  . Preventative health care 08/13/2015  . Rheumatoid factor positive 08/13/2015  . Lyme disease 07/22/2015  . Asthma 06/17/2015  . Other insomnia 06/17/2015    Past Medical History:  Diagnosis Date  . 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 Allergy   . Asthma   . Blood transfusion without reported diagnosis 1990s  . Depression   . Fibromyalgia   . History of chicken pox   . History of colon polyps   . History of migraine   . Rheumatoid arthritis (HCC)     Family History  Problem Relation Age of Onset  . Heart disease Mother   . Arthritis  Father   . Heart disease Father   . Stroke Father   . Diabetes Father   . Hypertension Father   . Heart attack Father   . Diabetes Brother   . Stroke Brother   . Diabetes Daughter   . Diabetes Brother   . Stroke Brother   . Stroke Sister   . Diabetes Sister   . Stroke Paternal Aunt   . Hypertension Paternal Aunt   . Diabetes Paternal Aunt   . Stroke Paternal Uncle   . Hypertension Paternal Uncle   . Diabetes Paternal Uncle   . Arthritis Maternal Grandmother   . Arthritis Maternal Grandfather   . Arthritis Paternal Grandmother   . Arthritis Paternal Grandfather    Past Surgical History:  Procedure Laterality Date  . ABDOMINAL HYSTERECTOMY  1990s  . CARPAL TUNNEL RELEASE    . KNEE ARTHROPLASTY    . SPINE SURGERY    . TONSILLECTOMY  1990s  . TOTAL SHOULDER ARTHROPLASTY     Social History   Social History Narrative   2 children, both grown one lives in [place] (son) he has 3 children, and one in [place] (son)   Works for [employer] in [employer].   Single,  Completed 12th grade   Has a dog.    Enjoys relaxing, sleeping     Objective: Vital Signs: There were no vitals taken for this visit.   Physical Exam   Musculoskeletal Exam: ***  CDAI Exam: No CDAI exam completed.    Investigation: No additional findings.   Imaging: No results found.  Speciality Comments: No specialty comments available.    Procedures:  No procedures performed Allergies: Hydrocodone; Iodine; Morphine and related; and Codeine   Assessment / Plan:     Visit Diagnoses: No diagnosis found.    Orders: No orders of the defined types were placed in this encounter.  No orders of the defined types were placed in this encounter.   Face-to-face time spent with patient was *** minutes. 50% of time was spent in counseling and coordination of care.  Follow-Up Instructions: No Follow-up on file.   Ellen Henri, CMA  Note - This record has been created using [employer].  Chart creation errors have been sought, but may not always  have been located. Such creation errors do not reflect on  the standard of medical care.

## 2017-02-11 ENCOUNTER — Ambulatory Visit: Payer: BLUE CROSS/BLUE SHIELD | Admitting: Rheumatology

## 2017-02-11 NOTE — Progress Notes (Signed)
Office Visit Note  Patient: Julie Kramer             Date of Birth: Oct 26, 1965           MRN: [id number]             PCP: Sandford Craze, NP Referring: Sandford Craze, NP Visit Date: 02/16/2017 Occupation: @GUAROCC @    Subjective:  Hand and feet pain   History of Present Illness: Julie Kramer is a 51 y.o. female with history of osteoarthritis and fibromyalgia.  Patient reports bilateral hand pain and feet pain.  She states she has noticed swelling in her hands.  She wears a brace during the day and at night.  She has been taking Bayer Aspirin for pain relief.  She states the pain has been waking her up at night.  She continues to take Cymbalta and Zanaflex.  She states her fibromyalgia has been flaring recently.  She continues to have insomnia due to pain level.  She has muscle tenderness of her trapezius muscles.  She reports she still has flares of her plantar fasciitis.  She states she saw her podiatrist who gave her a boot to wear, which helps and she performs foot exercises daily. She reports bilateral knee pain, worse in her right knee.  She has an appointment with Dr. Earma Reading who is an orthopedist through [facility] on 02/29/16 to discuss a knee replacement.        Activities of Daily Living:  Patient reports morning stiffness for 10  minutes.   Patient Reports nocturnal pain.  Difficulty dressing/grooming: Denies Difficulty climbing stairs: Reports Difficulty getting out of chair: Reports Difficulty using hands for taps, buttons, cutlery, and/or writing: Reports   Review of Systems  Constitutional: Positive for fatigue. Negative for weakness.  HENT: Positive for mouth dryness. Negative for mouth sores and nose dryness.   Eyes: Negative for redness and dryness.  Respiratory: Negative for cough, hemoptysis, shortness of breath and difficulty breathing.   Cardiovascular: Negative for chest pain, palpitations, hypertension and swelling in legs/feet.    Gastrointestinal: Negative for blood in stool, constipation and diarrhea.  Endocrine: Negative for increased urination.  Genitourinary: Negative for painful urination.  Musculoskeletal: Positive for arthralgias, joint pain, joint swelling, morning stiffness and muscle tenderness. Negative for myalgias, muscle weakness and myalgias.  Skin: Negative for color change, pallor, rash, hair loss, nodules/bumps, redness, skin tightness, ulcers and sensitivity to sunlight.  Allergic/Immunologic: Negative for susceptible to infections.  Neurological: Negative for dizziness, numbness and headaches.  Hematological: Negative for swollen glands.  Psychiatric/Behavioral: Positive for sleep disturbance. Negative for depressed mood. The patient is not nervous/anxious.     PMFS History:  Patient Active Problem List   Diagnosis Date Noted  . History of Lyme disease 09/30/2016  . Plantar fasciitis 09/30/2016  . Primary osteoarthritis of both knees 05/05/2016  . Primary osteoarthritis of both hands 05/05/2016  . Primary osteoarthritis of both feet 05/05/2016  . Fibromyalgia 05/05/2016  . History of asthma 05/05/2016  . Myalgia 01/08/2016  . Hand pain 01/08/2016  . Other fatigue 01/08/2016  . Preventative health care 08/13/2015  . Rheumatoid factor positive 08/13/2015  . Lyme disease 07/22/2015  . Asthma 06/17/2015  . Other insomnia 06/17/2015    Past Medical History:  Diagnosis Date  . 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 Allergy   . Asthma   . Blood transfusion without reported diagnosis 1990s  . Depression   . Fibromyalgia   .  History of chicken pox   . History of colon polyps   . History of migraine   . Rheumatoid arthritis (HCC)     Family History  Problem Relation Age of Onset  . Heart disease Mother   . Arthritis Father   . Heart disease Father   . Stroke Father   . Diabetes Father   . Hypertension Father   . Heart attack Father   .  Diabetes Brother   . Stroke Brother   . Diabetes Daughter   . Diabetes Brother   . Stroke Brother   . Stroke Sister   . Diabetes Sister   . Stroke Paternal Aunt   . Hypertension Paternal Aunt   . Diabetes Paternal Aunt   . Stroke Paternal Uncle   . Hypertension Paternal Uncle   . Diabetes Paternal Uncle   . Arthritis Maternal Grandmother   . Arthritis Maternal Grandfather   . Arthritis Paternal Grandmother   . Arthritis Paternal Grandfather    Past Surgical History:  Procedure Laterality Date  . ABDOMINAL HYSTERECTOMY  1990s  . CARPAL TUNNEL RELEASE    . KNEE ARTHROPLASTY    . SPINE SURGERY    . TONSILLECTOMY  1990s  . TOTAL SHOULDER ARTHROPLASTY     Social History   Social History Narrative   2 children, both grown [place] ([place]) he has 3 children, and one in [place] (son)   Works for [employer] in [employer].   Single,  Completed 12th grade   Has a dog.    Enjoys relaxing, sleeping     Objective: Vital Signs: BP 118/84 (BP Location: Left Arm, Patient Position: Sitting, Cuff Size: Normal)   Pulse 82   Resp 16   Ht 5' 2.5" (1.588 m)   Wt 216 lb (98 kg)   BMI 38.88 kg/m    Physical Exam  Constitutional: She is oriented to person, place, and time. She appears well-developed and well-nourished.  HENT:  Head: Normocephalic and atraumatic.  Eyes: Conjunctivae and EOM are normal.  Neck: Normal range of motion.  Cardiovascular: Normal rate, regular rhythm, normal heart sounds and intact distal pulses.  Pulmonary/Chest: Effort normal and breath sounds normal.  Abdominal: Soft. Bowel sounds are normal.  Lymphadenopathy:    She has no cervical adenopathy.  Neurological: She is alert and oriented to person, place, and time.  Skin: Skin is warm and dry. Capillary refill takes less than 2 seconds.  Psychiatric: She has a normal mood and affect. Her behavior is normal.  Nursing note and vitals reviewed.    Musculoskeletal Exam: C-spine, thoracic, and  lumbar spine good ROM.  Discomfort with full shoulder ROM.  Tenderness of trapezius.  Good ROM of elbows and wrists.  Complete fist.  MCPs, PIPs, and DIPs good ROM with no synovitis.  Left 3rd and 4th trigger finger.  Hip joints, knee joints, and ankle joints good ROM.  MTPs, PIPs, and DIPs good ROM with no synovitis.    CDAI Exam: No CDAI exam completed.    Investigation: No additional findings.   Imaging: Xr Foot 2 Views Left  Result Date: 02/16/2017 Mild first MTP joint space narrowing was noted. All PIP joint space narrowing was noted. No significant DIP joint space narrowing was noted. Spurring was noted in the third and fifth DIP joints. No intertarsal joint space narrowing was noted. Small inferior and posterior calcaneal spur was noted. Impression: These findings were consistent with mild osteoarthritis of the foot.  Xr Foot  2 Views Right  Result Date: 02/16/2017 First MTP, all PIP, all DIP joints narrowing was noted. No erosive changes were noted. No intertarsal joint space narrowing was noted. Small inferior and posterior calcaneal spurs were noted. Impression: These findings are consistent with osteoarthritis of the foot  Xr Hand 2 View Left  Result Date: 02/16/2017 PIP, DIP and CMC narrowing was noted. No MCP joint space narrowing or erosive changes were noted. No intercarpal, radiocarpal or metacarpocarpal joint space narrowing was noted. Impression: These findings are consistent with osteoarthritis of the hand.  Xr Hand 2 View Right  Result Date: 02/16/2017 PIP, DIP and CMC narrowing was noted. No MCP joint space narrowing or erosive changes were noted. No intercarpal, radiocarpal or metacarpocarpal joint space narrowing was noted. Impression: These findings are consistent with osteoarthritis of the hand.   Speciality Comments: No specialty comments available.    Procedures:  No procedures performed Allergies: Hydrocodone; Iodine; Morphine and related; and Codeine    Assessment / Plan:     Visit Diagnoses: Primary osteoarthritis of both hands: No synovitis on exam.  Joint protection and muscle strengthening were discussed.  X-rays were ordered today.  Discussed natural anti-inflammatories and a handout was provided.    Bilateral foot pain - Continues to have pain.  No synovitis on exam.  Plan: XR Foot 2 Views Right, XR Foot 2 Views Left, Sedimentation rate, Cyclic citrul peptide antibody, IgG, Uric acid  Primary osteoarthritis of both knees: Patient is has an appointment with her orthopedist Dr. Earma Reading on 02/28/17.  They are going to discuss moving forward with a knee replacement.    Primary osteoarthritis of both feet: X-rays performed today.    Bilateral hand pain - No synovitis on exam. Patient continues to have significant hand pain and swelling. X-rays were performed today.  A CCP, Sed rate, and uric acid level were drawn today.  An ultrasound of her hands/wrists will be scheduled.  Plan: XR Hand 2 View Right, XR Hand 2 View Left, Sedimentation rate, Cyclic citrul peptide antibody, IgG  Rheumatoid factor positive: We will check a CCP and sed rate today due to her reports hand swelling.    Fibromyalgia - She has hyperalgesia on exam.  She continues to take Cymbalta and Zanaflex.  Refill were sent to the pharmacy.     Plantar fasciitis: Patient performs daily exercises.  She sees a podiatrist.    Other fatigue: Chronic   Other insomnia: Continues to have insomnia.  She states the Zanaflex helps her sleep.   Other medical conditions are listed as follows:   History of Lyme disease  History of asthma    Orders: Orders Placed This Encounter  Procedures  . XR Hand 2 View Right  . XR Hand 2 View Left  . XR Foot 2 Views Right  . XR Foot 2 Views Left  . Sedimentation rate  . Cyclic citrul peptide antibody, IgG  . Uric acid   Meds ordered this encounter  Medications  . DULoxetine (CYMBALTA) 60 MG capsule    Sig: Take 1 capsule (60 mg  total) by mouth daily.    Dispense:  30 capsule    Refill:  3  . tiZANidine (ZANAFLEX) 4 MG tablet    Sig: Take 1 tablet (4 mg total) by mouth at bedtime.    Dispense:  90 tablet    Refill:  0    Face-to-face time spent with patient was 30 minutes. Greater than 50% of time was spent in counseling and  coordination of care.  Follow-Up Instructions: Return in about 5 months (around 07/17/2017) for Osteoarthritis, Fibromyalgia.  Pollyann Savoy, MD  Note - This record has been created using [employer].  Chart creation errors have been sought, but may not always  have been located. Such creation errors do not reflect on  the standard of medical care.

## 2017-02-16 ENCOUNTER — Ambulatory Visit (INDEPENDENT_AMBULATORY_CARE_PROVIDER_SITE_OTHER): Payer: Self-pay

## 2017-02-16 ENCOUNTER — Ambulatory Visit (INDEPENDENT_AMBULATORY_CARE_PROVIDER_SITE_OTHER): Payer: BLUE CROSS/BLUE SHIELD | Admitting: Rheumatology

## 2017-02-16 ENCOUNTER — Encounter: Payer: Self-pay | Admitting: Rheumatology

## 2017-02-16 VITALS — BP 118/84 | HR 82 | Resp 16 | Ht 62.5 in | Wt 216.0 lb

## 2017-02-16 DIAGNOSIS — M79672 Pain in left foot: Secondary | ICD-10-CM | POA: Diagnosis not present

## 2017-02-16 DIAGNOSIS — M722 Plantar fascial fibromatosis: Secondary | ICD-10-CM

## 2017-02-16 DIAGNOSIS — M797 Fibromyalgia: Secondary | ICD-10-CM | POA: Diagnosis not present

## 2017-02-16 DIAGNOSIS — Z8619 Personal history of other infectious and parasitic diseases: Secondary | ICD-10-CM | POA: Diagnosis not present

## 2017-02-16 DIAGNOSIS — M19071 Primary osteoarthritis, right ankle and foot: Secondary | ICD-10-CM | POA: Diagnosis not present

## 2017-02-16 DIAGNOSIS — M19042 Primary osteoarthritis, left hand: Secondary | ICD-10-CM

## 2017-02-16 DIAGNOSIS — M79671 Pain in right foot: Secondary | ICD-10-CM | POA: Diagnosis not present

## 2017-02-16 DIAGNOSIS — Z8709 Personal history of other diseases of the respiratory system: Secondary | ICD-10-CM | POA: Diagnosis not present

## 2017-02-16 DIAGNOSIS — M170 Bilateral primary osteoarthritis of knee: Secondary | ICD-10-CM

## 2017-02-16 DIAGNOSIS — R768 Other specified abnormal immunological findings in serum: Secondary | ICD-10-CM | POA: Diagnosis not present

## 2017-02-16 DIAGNOSIS — R5383 Other fatigue: Secondary | ICD-10-CM

## 2017-02-16 DIAGNOSIS — M79642 Pain in left hand: Secondary | ICD-10-CM | POA: Diagnosis not present

## 2017-02-16 DIAGNOSIS — M79641 Pain in right hand: Secondary | ICD-10-CM

## 2017-02-16 DIAGNOSIS — M19041 Primary osteoarthritis, right hand: Secondary | ICD-10-CM | POA: Diagnosis not present

## 2017-02-16 DIAGNOSIS — G4709 Other insomnia: Secondary | ICD-10-CM | POA: Diagnosis not present

## 2017-02-16 DIAGNOSIS — M19072 Primary osteoarthritis, left ankle and foot: Secondary | ICD-10-CM

## 2017-02-16 MED ORDER — TIZANIDINE HCL 4 MG PO TABS: 4.0000 mg | ORAL_TABLET | Freq: Every day | ORAL | 0 refills | Status: DC

## 2017-02-16 MED ORDER — DULOXETINE HCL 60 MG PO CPEP: 60.0000 mg | ORAL_CAPSULE | Freq: Every day | ORAL | 3 refills | Status: DC

## 2017-02-16 NOTE — Patient Instructions (Signed)
Natural anti-inflammatories  You can purchase these at [employer], [employer] or online.  . Turmeric (capsules)  . Ginger (ginger root or capsules)  . Omega 3 (Fish, flax seeds, chia seeds, walnuts, almonds)  . Tart cherry (dried or extract)   Patient should be under the care of a physician while taking these supplements. This may not be reproduced without the permission of Dr. Shaili Deveshwar.  

## 2017-02-28 DIAGNOSIS — M2241 Chondromalacia patellae, right knee: Secondary | ICD-10-CM | POA: Diagnosis not present

## 2017-05-04 ENCOUNTER — Other Ambulatory Visit: Payer: BLUE CROSS/BLUE SHIELD | Admitting: Rheumatology

## 2017-05-12 DIAGNOSIS — M24571 Contracture, right ankle: Secondary | ICD-10-CM | POA: Diagnosis not present

## 2017-05-12 DIAGNOSIS — M722 Plantar fascial fibromatosis: Secondary | ICD-10-CM | POA: Diagnosis not present

## 2017-05-13 ENCOUNTER — Other Ambulatory Visit: Payer: Self-pay | Admitting: Rheumatology

## 2017-05-13 NOTE — Telephone Encounter (Signed)
Last Visit: 02/16/17 Next Visit: 07/20/17  Okay to refill per Dr. Corliss Skains

## 2017-06-15 ENCOUNTER — Ambulatory Visit (INDEPENDENT_AMBULATORY_CARE_PROVIDER_SITE_OTHER): Payer: Self-pay

## 2017-06-15 ENCOUNTER — Encounter: Payer: Self-pay | Admitting: Rheumatology

## 2017-06-15 ENCOUNTER — Other Ambulatory Visit: Payer: Self-pay | Admitting: Rheumatology

## 2017-06-15 ENCOUNTER — Ambulatory Visit (INDEPENDENT_AMBULATORY_CARE_PROVIDER_SITE_OTHER): Payer: BLUE CROSS/BLUE SHIELD | Admitting: Rheumatology

## 2017-06-15 ENCOUNTER — Other Ambulatory Visit (INDEPENDENT_AMBULATORY_CARE_PROVIDER_SITE_OTHER): Payer: Self-pay | Admitting: *Deleted

## 2017-06-15 VITALS — BP 128/76 | HR 82 | Resp 15 | Ht 62.5 in | Wt 189.0 lb

## 2017-06-15 DIAGNOSIS — M722 Plantar fascial fibromatosis: Secondary | ICD-10-CM | POA: Diagnosis not present

## 2017-06-15 DIAGNOSIS — M170 Bilateral primary osteoarthritis of knee: Secondary | ICD-10-CM

## 2017-06-15 DIAGNOSIS — G4709 Other insomnia: Secondary | ICD-10-CM | POA: Diagnosis not present

## 2017-06-15 DIAGNOSIS — M797 Fibromyalgia: Secondary | ICD-10-CM

## 2017-06-15 DIAGNOSIS — M19042 Primary osteoarthritis, left hand: Secondary | ICD-10-CM

## 2017-06-15 DIAGNOSIS — M19041 Primary osteoarthritis, right hand: Secondary | ICD-10-CM | POA: Diagnosis not present

## 2017-06-15 DIAGNOSIS — M79671 Pain in right foot: Secondary | ICD-10-CM | POA: Diagnosis not present

## 2017-06-15 DIAGNOSIS — M79641 Pain in right hand: Secondary | ICD-10-CM | POA: Diagnosis not present

## 2017-06-15 DIAGNOSIS — M19072 Primary osteoarthritis, left ankle and foot: Secondary | ICD-10-CM

## 2017-06-15 DIAGNOSIS — M19071 Primary osteoarthritis, right ankle and foot: Secondary | ICD-10-CM

## 2017-06-15 DIAGNOSIS — Z8709 Personal history of other diseases of the respiratory system: Secondary | ICD-10-CM | POA: Diagnosis not present

## 2017-06-15 DIAGNOSIS — R5383 Other fatigue: Secondary | ICD-10-CM | POA: Diagnosis not present

## 2017-06-15 DIAGNOSIS — M79642 Pain in left hand: Secondary | ICD-10-CM

## 2017-06-15 DIAGNOSIS — M25571 Pain in right ankle and joints of right foot: Secondary | ICD-10-CM

## 2017-06-15 LAB — COMPLETE METABOLIC PANEL WITH GFR
AG Ratio: 1.5 (calc) (ref 1.0–2.5)
ALKALINE PHOSPHATASE (APISO): 126 U/L (ref 33–130)
ALT: 41 U/L — AB (ref 6–29)
AST: 36 U/L — ABNORMAL HIGH (ref 10–35)
Albumin: 4.1 g/dL (ref 3.6–5.1)
BILIRUBIN TOTAL: 0.6 mg/dL (ref 0.2–1.2)
BUN: 15 mg/dL (ref 7–25)
CHLORIDE: 108 mmol/L (ref 98–110)
CO2: 27 mmol/L (ref 20–32)
Calcium: 9.5 mg/dL (ref 8.6–10.4)
Creat: 0.73 mg/dL (ref 0.50–1.05)
GFR, Est African American: 109 mL/min/{1.73_m2} (ref >=60)
GFR, Est Non African American: 94 mL/min/{1.73_m2} (ref >=60)
GLUCOSE: 91 mg/dL (ref 65–99)
Globulin: 2.7 g/dL (calc) (ref 1.9–3.7)
Potassium: 4.4 mmol/L (ref 3.5–5.3)
Sodium: 141 mmol/L (ref 135–146)
Total Protein: 6.8 g/dL (ref 6.1–8.1)

## 2017-06-15 LAB — CBC WITH DIFFERENTIAL/PLATELET
BASOS ABS: 66 {cells}/uL (ref 0–200)
Basophils Relative: 0.8 %
EOS PCT: 1.7 %
Eosinophils Absolute: 141 cells/uL (ref 15–500)
HCT: 37 % (ref 35.0–45.0)
Hemoglobin: 12.4 g/dL (ref 11.7–15.5)
Lymphs Abs: 2548 cells/uL (ref 850–3900)
MCH: 27.8 pg (ref 27.0–33.0)
MCHC: 33.5 g/dL (ref 32.0–36.0)
MCV: 83 fL (ref 80.0–100.0)
MONOS PCT: 8.1 %
MPV: 11.2 fL (ref 7.5–12.5)
NEUTROS ABS: 4872 {cells}/uL (ref 1500–7800)
NEUTROS PCT: 58.7 %
PLATELETS: 259 10*3/uL (ref 140–400)
RBC: 4.46 10*6/uL (ref 3.80–5.10)
RDW: 13.7 % (ref 11.0–15.0)
Total Lymphocyte: 30.7 %
WBC mixed population: 672 cells/uL (ref 200–950)
WBC: 8.3 10*3/uL (ref 3.8–10.8)

## 2017-06-15 LAB — SEDIMENTATION RATE: SED RATE: 9 mm/h (ref 0–30)

## 2017-06-15 LAB — URIC ACID: URIC ACID, SERUM: 3.3 mg/dL (ref 2.5–7.0)

## 2017-06-15 NOTE — Progress Notes (Signed)
Office Visit Note  Patient: Julie Kramer             Date of Birth: [birth year]           MRN: [id number]             PCP: Sandford Craze, NP Referring: Sandford Craze, NP Visit Date: 06/15/2017 Occupation: @GUAROCC @    Subjective:  Pain in right foot and bilateral hands.   History of Present Illness: Julie Kramer is a 52 y.o. female with history of osteoarthritis.  She states she has been having pain and stiffness in her bilateral hands.  She has difficulty making a fist in the morning which gets better during the daytime.  She denies any joint swelling.  She continues to have some discomfort in her bilateral knee joints and bilateral feet.  She states recently she has been having increased pain in her right foot which she describes over the lateral aspect.  She was seen by a podiatrist who felt her symptoms were consistent with heel spur and gave her cortisone injection without much relief.  She continues to have discomfort in her right foot.  She also was seen by a sports medicine doctor for right knee joint pain and had a cortisone injection which has helped.  Her fibromyalgia pain is manageable currently.  Activities of Daily Living:  Patient reports morning stiffness for 15 minutes.   Patient Reports nocturnal pain.  Difficulty dressing/grooming: Denies Difficulty climbing stairs: Reports Difficulty getting out of chair: Reports Difficulty using hands for taps, buttons, cutlery, and/or writing: Denies   Review of Systems  Constitutional: Positive for fatigue. Negative for night sweats, weight gain and weight loss.  HENT: Negative for mouth sores, trouble swallowing, trouble swallowing, mouth dryness and nose dryness.   Eyes: Negative for pain, redness, visual disturbance and dryness.  Respiratory: Negative for cough, shortness of breath and difficulty breathing.   Cardiovascular: Negative for chest pain, palpitations, hypertension, irregular heartbeat and  swelling in legs/feet.  Gastrointestinal: Positive for diarrhea. Negative for blood in stool and constipation.       History of IBS  Endocrine: Negative for increased urination.  Genitourinary: Negative for vaginal dryness.  Musculoskeletal: Positive for arthralgias, joint pain, myalgias, morning stiffness and myalgias. Negative for joint swelling, muscle weakness and muscle tenderness.  Skin: Negative for color change, rash, hair loss, skin tightness, ulcers and sensitivity to sunlight.  Allergic/Immunologic: Negative for susceptible to infections.  Neurological: Negative for dizziness, memory loss, night sweats and weakness.  Hematological: Negative for swollen glands.  Psychiatric/Behavioral: Positive for sleep disturbance. Negative for depressed mood. The patient is not nervous/anxious.     PMFS History:  Patient Active Problem List   Diagnosis Date Noted  . History of Lyme disease 09/30/2016  . Plantar fasciitis 09/30/2016  . Primary osteoarthritis of both knees 05/05/2016  . Primary osteoarthritis of both hands 05/05/2016  . Primary osteoarthritis of both feet 05/05/2016  . Fibromyalgia 05/05/2016  . History of asthma 05/05/2016  . Myalgia 01/08/2016  . Hand pain 01/08/2016  . Other fatigue 01/08/2016  . Preventative health care 08/13/2015  . Rheumatoid factor positive 08/13/2015  . Lyme disease 07/22/2015  . Asthma 06/17/2015  . Other insomnia 06/17/2015    Past Medical History:  Diagnosis Date  . 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 Allergy   . Asthma   . Blood transfusion without reported diagnosis 1990s  . Depression   . Fibromyalgia   .  History of chicken pox   . History of colon polyps   . History of migraine   . Rheumatoid arthritis (HCC)     Family History  Problem Relation Age of Onset  . Heart disease Mother   . Arthritis Father   . Heart disease Father   . Stroke Father   . Diabetes Father   . Hypertension  Father   . Heart attack Father   . Diabetes Brother   . Stroke Brother   . Diabetes Brother   . Stroke Brother   . Stroke Sister   . Diabetes Sister   . Stroke Paternal Aunt   . Hypertension Paternal Aunt   . Diabetes Paternal Aunt   . Stroke Paternal Uncle   . Hypertension Paternal Uncle   . Diabetes Paternal Uncle   . Arthritis Maternal Grandmother   . Arthritis Maternal Grandfather   . Arthritis Paternal Grandmother   . Arthritis Paternal Grandfather    Past Surgical History:  Procedure Laterality Date  . ABDOMINAL HYSTERECTOMY  1990s  . CARPAL TUNNEL RELEASE    . KNEE ARTHROPLASTY    . SPINE SURGERY    . TONSILLECTOMY  1990s  . TOTAL SHOULDER ARTHROPLASTY     Social History   Social History Narrative   2 children, both grown one lives in [place] (son) he has 3 children, and one in [place] (son)   Works for [employer] in [employer].   Single,  Completed 12th grade   Has a dog.    Enjoys relaxing, sleeping     Objective: Vital Signs: BP 128/76 (BP Location: Left Arm, Patient Position: Sitting, Cuff Size: Large)   Pulse 82   Resp 15   Ht 5' 2.5" (1.588 m)   Wt 189 lb (85.7 kg) Comment: per patient  BMI 34.02 kg/m    Physical Exam  Constitutional: She is oriented to person, place, and time. She appears well-developed and well-nourished.  HENT:  Head: Normocephalic and atraumatic.  Eyes: Conjunctivae and EOM are normal.  Neck: Normal range of motion.  Cardiovascular: Normal rate, regular rhythm, normal heart sounds and intact distal pulses.  Pulmonary/Chest: Effort normal and breath sounds normal.  Abdominal: Soft. Bowel sounds are normal.  Lymphadenopathy:    She has no cervical adenopathy.  Neurological: She is alert and oriented to person, place, and time.  Skin: Skin is warm and dry. Capillary refill takes less than 2 seconds.  Psychiatric: She has a normal mood and affect. Her behavior is normal.  Nursing note and vitals reviewed.     Musculoskeletal Exam: C-spine lumbar spine good range of motion.  Shoulder joints of the transverse joint MCPs PIPs DIPs been good range of motion.  She has some tenderness on palpation of bilateral wrist joints but no synovitis was noted.  She is good range of motion of bilateral hip joints knee joints ankles MTPs PIPs.  She has tenderness over the lateral aspect of her right foot under the lateral malleolus.  She also has tenderness on palpation of her left heel.  No warmth swelling or effusion was noted.  She has some generalized hyperalgesia.  CDAI Exam: No CDAI exam completed.    Investigation: No additional findings.   Imaging: [place] Extrem Up Bilat Comp  Result Date: 06/15/2017 Ultrasound examination of bilateral hands was performed per EULAR recommendations. Using 12 MHz transducer, grayscale and power Doppler bilateral second, third, and fifth MCP joints and bilateral wrist joints both dorsal and volar aspects were evaluated  to look for synovitis or tenosynovitis. The findings were there was no synovitis or tenosynovitis on ultrasound examination except mild hyperemia over right wrist joint. Right median nerve was 0.10 cm squares which was within normal limits and left median nerve was 0.10 cm squares which was within normal limits. Impression: Ultrasound examination mild hyperemia in the right wrist joint.  Bilateral median nerves are within normal limits.  Xr Foot Complete Right  Result Date: 06/15/2017 Right first MTP PIP narrowing was noted.  Subtalar joint space narrowing was noted.  Inferior and posterior calcaneal spur was noted.  No erosive changes were noted. Impression: These findings are consistent with osteoarthritis of the foot.   Speciality Comments: No specialty comments available.    Procedures:  No procedures performed Allergies: Hydrocodone; Iodine; Morphine and related; and Codeine   Assessment / Plan:     Visit Diagnoses: Pain in both hands - Positive  rheumatoid factor - Plan: [place] Extrem Up Bilat Comp.  The ultrasound today showed mild hyperemia in the right wrist joint otherwise unremarkable.  I will obtain labs today.  Pain in right foot -patient reports severe pain and discomfort in her right ankle to the point she is having difficulty walking.  She had cortisone injection in her heel by her podiatrist without any relief.  X-ray of her right foot was unremarkable today except for mild osteoarthritis in the first MTP and calcaneal spur.  I will obtain following labs today.  I would also schedule MRI of her right ankle joint to evaluate this further.  Plan: Uric acid, Sedimentation rate, Rheumatoid factor, Cyclic citrul peptide antibody, IgG, ANA, 14-3-3 eta Protein,   Other fatigue - Plan: CBC with Differential/Platelet, COMPLETE METABOLIC PANEL WITH GFR, Glucose 6 phosphate dehydrogenase  Primary osteoarthritis of both hands-she has mild osteoarthritic changes in her hands.  Primary osteoarthritis of both knees-patient states that she has severe osteoarthritis in her knee joints which causes discomfort.  Primary osteoarthritis of both feet-she has mild osteoarthritis in her feet  Plantar fasciitis-she has history of recurrent plantar fasciitis.  Fibromyalgia-she continues to have some generalized hyperalgesia.  History of asthma  Other insomnia    Orders: Orders Placed This Encounter  Procedures  . [place] Extrem Up Bilat Comp  . CBC with Differential/Platelet  . COMPLETE METABOLIC PANEL WITH GFR  . Uric acid  . Sedimentation rate  . Rheumatoid factor  . Cyclic citrul peptide antibody, IgG  . ANA  . 14-3-3 eta Protein  . Glucose 6 phosphate dehydrogenase   No orders of the defined types were placed in this encounter.   Face-to-face time spent with patient was 30 minutes.Greater than 50% of time was spent in counseling and coordination of care.  Follow-Up Instructions: Return in about 1 month (around 07/13/2017) for  Osteoarthritis, FMS.   Pollyann Savoy, MD  Note - This record has been created using Animal nutritionist.  Chart creation errors have been sought, but may not always  have been located. Such creation errors do not reflect on  the standard of medical care.

## 2017-06-16 LAB — GLUCOSE 6 PHOSPHATE DEHYDROGENASE: G-6PDH: 19.4 U/g Hgb (ref 7.0–20.5)

## 2017-06-16 LAB — CYCLIC CITRUL PEPTIDE ANTIBODY, IGG

## 2017-06-16 LAB — RHEUMATOID FACTOR: Rhuematoid fact SerPl-aCnc: 58 IU/mL — ABNORMAL HIGH (ref <14)

## 2017-06-17 LAB — ANA: Anti Nuclear Antibody(ANA): NEGATIVE

## 2017-06-19 LAB — 14-3-3 ETA PROTEIN

## 2017-06-20 NOTE — Progress Notes (Signed)
ESR is high. Please sch US bilateral hands.

## 2017-06-23 NOTE — Progress Notes (Signed)
Her ESR is high and rheumatoid factor is positive.  She continues to have pain and discomfort in her bilateral hands I will consider starting her on Plaquenil.  She can wait until the follow-up visit or if there is a cancellation we can see her earlier.

## 2017-06-24 ENCOUNTER — Ambulatory Visit (HOSPITAL_COMMUNITY): Admission: RE | Admit: 2017-06-24 | Payer: BLUE CROSS/BLUE SHIELD | Source: Ambulatory Visit

## 2017-07-01 NOTE — Progress Notes (Deleted)
Office Visit Note  Patient: Julie Kramer             Date of Birth: [birth year]           MRN: [id number]             PCP: Sandford Craze, NP Referring: Sandford Craze, NP Visit Date: 07/06/2017 Occupation: @GUAROCC @    Subjective:  No chief complaint on file.   History of Present Illness: Cyanna Neace is a 52 y.o. female ***   Activities of Daily Living:  Patient reports morning stiffness for *** {minute/hour:19697}.   Patient {ACTIONS;DENIES/REPORTS:21021675::"Denies"} nocturnal pain.  Difficulty dressing/grooming: {ACTIONS;DENIES/REPORTS:21021675::"Denies"} Difficulty climbing stairs: {ACTIONS;DENIES/REPORTS:21021675::"Denies"} Difficulty getting out of chair: {ACTIONS;DENIES/REPORTS:21021675::"Denies"} Difficulty using hands for taps, buttons, cutlery, and/or writing: {ACTIONS;DENIES/REPORTS:21021675::"Denies"}   No Rheumatology ROS completed.   PMFS History:  Patient Active Problem List   Diagnosis Date Noted  . History of Lyme disease 09/30/2016  . Plantar fasciitis 09/30/2016  . Primary osteoarthritis of both knees 05/05/2016  . Primary osteoarthritis of both hands 05/05/2016  . Primary osteoarthritis of both feet 05/05/2016  . Fibromyalgia 05/05/2016  . History of asthma 05/05/2016  . Myalgia 01/08/2016  . Hand pain 01/08/2016  . Other fatigue 01/08/2016  . Preventative health care 08/13/2015  . Rheumatoid factor positive 08/13/2015  . Lyme disease 07/22/2015  . Asthma 06/17/2015  . Other insomnia 06/17/2015    Past Medical History:  Diagnosis Date  . 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 Allergy   . Asthma   . Blood transfusion without reported diagnosis 1990s  . Depression   . Fibromyalgia   . History of chicken pox   . History of colon polyps   . History of migraine   . Rheumatoid arthritis (HCC)     Family History  Problem Relation Age of Onset  . Heart disease Mother   . Arthritis  Father   . Heart disease Father   . Stroke Father   . Diabetes Father   . Hypertension Father   . Heart attack Father   . Diabetes Brother   . Stroke Brother   . Diabetes Brother   . Stroke Brother   . Stroke Sister   . Diabetes Sister   . Stroke Paternal Aunt   . Hypertension Paternal Aunt   . Diabetes Paternal Aunt   . Stroke Paternal Uncle   . Hypertension Paternal Uncle   . Diabetes Paternal Uncle   . Arthritis Maternal Grandmother   . Arthritis Maternal Grandfather   . Arthritis Paternal Grandmother   . Arthritis Paternal Grandfather    Past Surgical History:  Procedure Laterality Date  . ABDOMINAL HYSTERECTOMY  1990s  . CARPAL TUNNEL RELEASE    . KNEE ARTHROPLASTY    . SPINE SURGERY    . TONSILLECTOMY  1990s  . TOTAL SHOULDER ARTHROPLASTY     Social History   Social History Narrative   2 children, both grown one lives in [place]/2015 (son) he has 3 children, and one in [place] (son)   Works for [place] in [employer].   Single,  Completed 12th grade   Has a dog.    Enjoys relaxing, sleeping     Objective: Vital Signs: There were no vitals taken for this visit.   Physical Exam   Musculoskeletal Exam: ***  CDAI Exam: No CDAI exam completed.    Investigation: No additional findings. CBC Latest Ref Rng & Units 06/15/2017 08/12/2015 05/12/2015  WBC  3.8 - 10.8 Thousand/uL 8.3 6.6 7.7  Hemoglobin 11.7 - 15.5 g/dL 93.8 18.2 99.3  Hematocrit 35.0 - 45.0 % 37.0 37.9 39.0  Platelets 140 - 400 Thousand/uL 259 234.0 233   CMP Latest Ref Rng & Units 06/15/2017 08/12/2015 05/12/2015  Glucose 65 - 99 mg/dL 91 98 96  BUN 7 - 25 mg/dL 15 17 18   Creatinine 0.50 - 1.05 mg/dL 7.16 9.67  Sodium 135 - 146 mmol/L 141 137 137  Potassium 3.5 - 5.3 mmol/L 4.4 4.3 4.4  Chloride 98 - 110 mmol/L 108 108 104  CO2 20 - 32 mmol/L 27 26 26   Calcium 8.6 - 10.4 mg/dL 9.5 9.2 9.1  Total Protein 6.1 - 8.1 g/dL 6.8 7.1 -  Total Bilirubin 0.2 - 1.2 mg/dL 0.6 0.4 -  Alkaline  Phos 39 - 117 U/L - 127(H) -  AST 10 - 35 U/L 36(H) 26 -  ALT 6 - 29 U/L 41(H) 39(H) -    Imaging: 8.93 Extrem Up Bilat Comp  Result Date: 06/15/2017 Ultrasound examination of bilateral hands was performed per EULAR recommendations. Using 12 MHz transducer, grayscale and power Doppler bilateral second, third, and fifth MCP joints and bilateral wrist joints both dorsal and volar aspects were evaluated to look for synovitis or tenosynovitis. The findings were there was no synovitis or tenosynovitis on ultrasound examination except mild hyperemia over right wrist joint. Right median nerve was 0.10 cm squares which was within normal limits and left median nerve was 0.10 cm squares which was within normal limits. Impression: Ultrasound examination mild hyperemia in the right wrist joint.  Bilateral median nerves are within normal limits.  Xr Foot Complete Right  Result Date: 06/15/2017 Right first MTP PIP narrowing was noted.  Subtalar joint space narrowing was noted.  Inferior and posterior calcaneal spur was noted.  No erosive changes were noted. Impression: These findings are consistent with osteoarthritis of the foot.   Speciality Comments: No specialty comments available.    Procedures:  No procedures performed Allergies: Hydrocodone; Iodine; Morphine and related; and Codeine   Assessment / Plan:     Visit Diagnoses: Rheumatoid factor positive - RF +, CCP -discuss PLQ  Primary osteoarthritis of both hands  Primary osteoarthritis of both knees  Primary osteoarthritis of both feet  Plantar fasciitis  Fibromyalgia  Other fatigue  Other insomnia  History of asthma  History of Lyme disease    Orders: No orders of the defined types were placed in this encounter.  No orders of the defined types were placed in this encounter.   Face-to-face time spent with patient was *** minutes. 50% of time was spent in counseling and coordination of care.  Follow-Up Instructions: No  follow-ups on file.   06/17/2017, PA-C  Note - This record has been created using Dragon software.  Chart creation errors have been sought, but may not always  have been located. Such creation errors do not reflect on  the standard of medical care.

## 2017-07-06 ENCOUNTER — Ambulatory Visit: Payer: Self-pay | Admitting: Physician Assistant

## 2017-07-20 ENCOUNTER — Ambulatory Visit: Payer: BLUE CROSS/BLUE SHIELD | Admitting: Rheumatology

## 2017-08-03 ENCOUNTER — Other Ambulatory Visit: Payer: Self-pay | Admitting: Rheumatology

## 2017-08-07 ENCOUNTER — Other Ambulatory Visit: Payer: Self-pay | Admitting: Rheumatology

## 2017-09-26 NOTE — Progress Notes (Signed)
Office Visit Note  Patient: Julie Kramer             Date of Birth: 1965-03-10           MRN: [id number]             PCP: Sandford Craze, NP Referring: Sandford Craze, NP Visit Date: 09/27/2017 Occupation: @GUAROCC @  Subjective:  Generalized pain   History of Present Illness: Julie Kramer is a 52 y.o. female with history of osteoarthritis and fibromyalgia.  She had ultrasound of bilateral hands performed on 06/15/2017.  She continues to have significant pain in bilateral hands and bilateral knee joints.  She states she has noticed swelling in her bilateral hands and right knee.  She describes the pain as constant sharp pains  She states the pain is been waking her up at night and she does not room the last time she had a full night sleep.  She states that she has tried taking several different types of NSAIDs but it does no pain relief.  She is also tried wearing braces as well as a knee brace without any relief.  She is also tried topical creams as well as hemp.  She is also tried taking turmeric with no relief.  She states that she has been drinking several beers a night to help relieve the pain.  She states she has tried taking the pain dietary changes but has not noticed any benefit.  She previously had cortisone injections in her right knee joint but only had relief for 2 to 3 days.  She has right shoulder pain previous had a right rotator cuff repair.  She has pain with range of motion and feels as though she has right upper extremity muscle weakness.  She states her fibromyalgia has been flaring for several months due to increased pain and stress.  She continues to have chronic fatigue.   Activities of Daily Living:  Patient reports morning stiffness for 15 minutes.   Patient Reports nocturnal pain.  Difficulty dressing/grooming: Reports Difficulty climbing stairs: Reports Difficulty getting out of chair: Reports Difficulty using hands for taps, buttons, cutlery,  and/or writing: Reports  Review of Systems  Constitutional: Positive for fatigue.  HENT: Positive for mouth dryness. Negative for mouth sores and nose dryness.   Eyes: Negative for pain, visual disturbance and dryness.  Respiratory: Negative for cough, hemoptysis, shortness of breath and difficulty breathing.   Cardiovascular: Positive for swelling in legs/feet. Negative for chest pain, palpitations and hypertension.  Gastrointestinal: Positive for constipation and diarrhea. Negative for blood in stool.  Endocrine: Negative for increased urination.  Genitourinary: Negative for difficulty urinating and painful urination.  Musculoskeletal: Positive for arthralgias, joint pain, joint swelling, muscle weakness, morning stiffness and muscle tenderness. Negative for myalgias and myalgias.  Skin: Negative for color change, pallor, rash, hair loss, nodules/bumps, skin tightness, ulcers and sensitivity to sunlight.  Allergic/Immunologic: Negative for susceptible to infections.  Neurological: Negative for dizziness, numbness and headaches.  Hematological: Negative for swollen glands.  Psychiatric/Behavioral: Positive for sleep disturbance. Negative for depressed mood. The patient is not nervous/anxious.     PMFS History:  Patient Active Problem List   Diagnosis Date Noted  . History of Lyme disease 09/30/2016  . Plantar fasciitis 09/30/2016  . Primary osteoarthritis of both knees 05/05/2016  . Primary osteoarthritis of both hands 05/05/2016  . Primary osteoarthritis of both feet 05/05/2016  . Fibromyalgia 05/05/2016  . History of asthma 05/05/2016  . Myalgia 01/08/2016  . Hand pain  01/08/2016  . Other fatigue 01/08/2016  . Preventative health care 08/13/2015  . Rheumatoid factor positive 08/13/2015  . Lyme disease 07/22/2015  . Asthma 06/17/2015  . Other insomnia 06/17/2015    Past Medical History:  Diagnosis Date  . 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 Allergy   . Asthma   . Blood transfusion without reported diagnosis 1990s  . Depression   . Fibromyalgia   . History of chicken pox   . History of colon polyps   . History of migraine   . Lyme disease   . Rheumatoid arthritis (HCC)     Family History  Problem Relation Age of Onset  . Heart disease Mother   . Arthritis Father   . Heart disease Father   . Stroke Father   . Diabetes Father   . Hypertension Father   . Heart attack Father   . Diabetes Brother   . Stroke Brother   . Diabetes Brother   . Stroke Brother   . Stroke Sister   . Diabetes Sister   . Stroke Paternal Aunt   . Hypertension Paternal Aunt   . Diabetes Paternal Aunt   . Stroke Paternal Uncle   . Hypertension Paternal Uncle   . Diabetes Paternal Uncle   . Arthritis Maternal Grandmother   . Arthritis Maternal Grandfather   . Arthritis Paternal Grandmother   . Arthritis Paternal Grandfather    Past Surgical History:  Procedure Laterality Date  . ABDOMINAL HYSTERECTOMY  1990s  . CARPAL TUNNEL RELEASE    . KNEE ARTHROPLASTY    . SPINE SURGERY    . TONSILLECTOMY  1990s  . TOTAL SHOULDER ARTHROPLASTY     Social History   Social History Narrative   2 children, both grown one lives in [place] (son) he has 3 children, and one in [place] (son)   Works for [employer] in [employer].   Single,  Completed 12th grade   Has a dog.    Enjoys relaxing, sleeping    Objective: Vital Signs: BP 139/75 (BP Location: Left Arm, Patient Position: Sitting, Cuff Size: Normal)   Pulse 86   Resp 16   Ht 5' 2.5" (1.588 m)   Wt 211 lb (95.7 kg)   BMI 37.98 kg/m    Physical Exam  Constitutional: She is oriented to person, place, and time. She appears well-developed and well-nourished.  HENT:  Head: Normocephalic and atraumatic.  Eyes: Conjunctivae and EOM are normal.  Neck: Normal range of motion.  Cardiovascular: Normal rate, regular rhythm, normal heart sounds and intact distal pulses.    Pulmonary/Chest: Effort normal and breath sounds normal.  Abdominal: Soft. Bowel sounds are normal.  Lymphadenopathy:    She has no cervical adenopathy.  Neurological: She is alert and oriented to person, place, and time.  Skin: Skin is warm and dry. Capillary refill takes less than 2 seconds.  Psychiatric: She has a normal mood and affect. Her behavior is normal.  Tearful during visit.  Nursing note and vitals reviewed.    Musculoskeletal Exam:  Generalized hyperalgesia on exam. C-spine limited ROM.  Thoracic and lumbar spine good ROM.  Right shoulder full ROM with discomfort.  Left shoulder full ROM with no discomfort.  Elbow joints, wrist joints, MCPs, PIPs, and DIPs good ROM with no apparent synovitis.  Hip joints good ROM.  No tenderness of trochanteric bursa bilaterally.  Limited extension of the right knee on exam.  She  has tenderness along the right ankle joint line.  Ankle joints good ROM with no synovitis.   CDAI Exam: No CDAI exam completed.   Investigation: No additional findings.  Imaging: No results found.  Recent Labs: Lab Results  Component Value Date   WBC 8.3 06/15/2017   HGB 12.4 06/15/2017   PLT 259 06/15/2017   NA 141 06/15/2017   K 4.4 06/15/2017   CL 108 06/15/2017   CO2 27 06/15/2017   GLUCOSE 91 06/15/2017   BUN 15 06/15/2017   CREATININE 0.73 06/15/2017   BILITOT 0.6 06/15/2017   ALKPHOS 127 (H) 08/12/2015   AST 36 (H) 06/15/2017   ALT 41 (H) 06/15/2017   PROT 6.8 06/15/2017   ALBUMIN 4.0 08/12/2015   CALCIUM 9.5 06/15/2017   GFRAA 109 06/15/2017    Speciality Comments: No specialty comments available.  Procedures:  No procedures performed Allergies: Hydrocodone; Iodine; Morphine and related; and Codeine   Assessment / Plan:     Visit Diagnoses: Primary osteoarthritis of both hands - RF+,CCP-, 14-3-3 eta, u/s on 06/15/17- showed mild hyperemia in the right wrist joint otherwise unremarkable: She has no synovitis on exam.  She has been  having severe sharp pains in bilateral hands and wrist joints.  Since the ultrasound did not reveal synovitis and no synovitis was noted on exam, we will not start her on PLQ at this time.  We will schedule a total body bone scan to rule out inflammatory arthritis.  We will refer her to pain management.   Primary osteoarthritis of both knees: Chronic pain. She has no warmth or effusion. She has severe right knee pain.  She previously had bilateral arthroscopic surgery.  She reports the pain has been keeping her up at night, and she has had difficulty walking at times.  She previously had cortisone injections, which provided only temporary relief for 2-3 days. We will refer her to pain management.   Primary osteoarthritis of both feet: She has mild osteoarthritic changes. The importance of wearing proper fitting shoes was discussed.   Plantar fasciitis - Resolved  Fibromyalgia -She has generalized hyperalgesia on exam.  She has been having a fibromyalgia flare lately.  She has generalized muscle aches and muscle tenderness. She is no longer taking Cymbalta and Zanaflex due to not feeling like they made any benefit.  She has tried taking NSAIDs OTC without any pain relief. She has chronic fatigue and insomnia.   Other fatigue: Chronic and related to insomnia.   History of asthma  Other insomnia: Worsening.  She has been having severe pain at night, which has led to interrupted sleep at night.   History of Lyme disease   Orders: No orders of the defined types were placed in this encounter.  No orders of the defined types were placed in this encounter.   Face-to-face time spent with patient was 30 minutes. Greater than 50% of time was spent in counseling and coordination of care.  Follow-Up Instructions: Return in about 3 months (around 12/28/2017) for Osteoarthritis, Fibromyalgia.   Gearldine Bienenstock, PA-C  Note - This record has been created using Dragon software.  Chart creation errors have  been sought, but may not always  have been located. Such creation errors do not reflect on  the standard of medical care.

## 2017-09-27 ENCOUNTER — Encounter: Payer: Self-pay | Admitting: Physician Assistant

## 2017-09-27 ENCOUNTER — Encounter (INDEPENDENT_AMBULATORY_CARE_PROVIDER_SITE_OTHER): Payer: Self-pay

## 2017-09-27 ENCOUNTER — Ambulatory Visit (INDEPENDENT_AMBULATORY_CARE_PROVIDER_SITE_OTHER): Payer: BLUE CROSS/BLUE SHIELD | Admitting: Physician Assistant

## 2017-09-27 VITALS — BP 139/75 | HR 86 | Resp 16 | Ht 62.5 in | Wt 211.0 lb

## 2017-09-27 DIAGNOSIS — M19041 Primary osteoarthritis, right hand: Secondary | ICD-10-CM | POA: Diagnosis not present

## 2017-09-27 DIAGNOSIS — Z8619 Personal history of other infectious and parasitic diseases: Secondary | ICD-10-CM

## 2017-09-27 DIAGNOSIS — M797 Fibromyalgia: Secondary | ICD-10-CM

## 2017-09-27 DIAGNOSIS — R5383 Other fatigue: Secondary | ICD-10-CM

## 2017-09-27 DIAGNOSIS — M170 Bilateral primary osteoarthritis of knee: Secondary | ICD-10-CM

## 2017-09-27 DIAGNOSIS — M19072 Primary osteoarthritis, left ankle and foot: Secondary | ICD-10-CM

## 2017-09-27 DIAGNOSIS — M19071 Primary osteoarthritis, right ankle and foot: Secondary | ICD-10-CM

## 2017-09-27 DIAGNOSIS — G4709 Other insomnia: Secondary | ICD-10-CM

## 2017-09-27 DIAGNOSIS — M722 Plantar fascial fibromatosis: Secondary | ICD-10-CM

## 2017-09-27 DIAGNOSIS — M19042 Primary osteoarthritis, left hand: Secondary | ICD-10-CM

## 2017-09-27 DIAGNOSIS — Z8709 Personal history of other diseases of the respiratory system: Secondary | ICD-10-CM

## 2017-10-06 ENCOUNTER — Telehealth: Payer: Self-pay | Admitting: Rheumatology

## 2017-10-06 NOTE — Telephone Encounter (Signed)
Returned patient's call and advised patient the referral has been placed. I advised patient it has to be reviewed by that office and they will contact her to schedule. Patient verbalized understanding.  I will check with Jasmine December on the total body bone scan.

## 2017-10-06 NOTE — Telephone Encounter (Signed)
Patient called to check on the status of her referral to pain management and appointment for her bone density scan.  Patient states she has been unable to sleep due to pain and in order to stay awake at work drinks 2 16 oz energy drinks per day, as well as mountain dew.  Patient states she knows that this is unhealthy but she is "suffering" at night.   Patient requested a return call.  [phone] (before 2)  or   [phone] (after 2)

## 2017-10-07 ENCOUNTER — Other Ambulatory Visit: Payer: Self-pay | Admitting: *Deleted

## 2017-10-07 DIAGNOSIS — M1990 Unspecified osteoarthritis, unspecified site: Secondary | ICD-10-CM

## 2017-10-17 ENCOUNTER — Encounter (HOSPITAL_COMMUNITY)
Admission: RE | Admit: 2017-10-17 | Discharge: 2017-10-17 | Disposition: A | Discharge status: Home / Self Care | Payer: BLUE CROSS/BLUE SHIELD | Source: Ambulatory Visit | Attending: Rheumatology | Admitting: Rheumatology

## 2017-10-17 DIAGNOSIS — M19012 Primary osteoarthritis, left shoulder: Secondary | ICD-10-CM | POA: Diagnosis not present

## 2017-10-17 DIAGNOSIS — M170 Bilateral primary osteoarthritis of knee: Secondary | ICD-10-CM | POA: Diagnosis not present

## 2017-10-17 DIAGNOSIS — M47812 Spondylosis without myelopathy or radiculopathy, cervical region: Secondary | ICD-10-CM | POA: Insufficient documentation

## 2017-10-17 DIAGNOSIS — M19072 Primary osteoarthritis, left ankle and foot: Secondary | ICD-10-CM | POA: Diagnosis not present

## 2017-10-17 DIAGNOSIS — M1990 Unspecified osteoarthritis, unspecified site: Secondary | ICD-10-CM | POA: Diagnosis not present

## 2017-10-17 DIAGNOSIS — M19071 Primary osteoarthritis, right ankle and foot: Secondary | ICD-10-CM | POA: Insufficient documentation

## 2017-10-17 DIAGNOSIS — M47816 Spondylosis without myelopathy or radiculopathy, lumbar region: Secondary | ICD-10-CM | POA: Diagnosis not present

## 2017-10-17 DIAGNOSIS — M19011 Primary osteoarthritis, right shoulder: Secondary | ICD-10-CM | POA: Diagnosis not present

## 2017-10-17 DIAGNOSIS — R52 Pain, unspecified: Secondary | ICD-10-CM | POA: Diagnosis not present

## 2017-10-17 MED ORDER — TECHNETIUM TC 99M MEDRONATE IV KIT
20.0000 | PACK | Freq: Once | INTRAVENOUS | Status: AC | PRN
  Administered 2017-10-17: 20 via INTRAVENOUS

## 2017-10-18 ENCOUNTER — Encounter: Payer: Self-pay | Admitting: Physical Medicine & Rehabilitation

## 2017-10-18 ENCOUNTER — Encounter
Payer: BLUE CROSS/BLUE SHIELD | Attending: Physical Medicine & Rehabilitation | Admitting: Physical Medicine & Rehabilitation

## 2017-10-18 VITALS — BP 114/83 | HR 89 | Ht 62.0 in | Wt 210.2 lb

## 2017-10-18 DIAGNOSIS — M170 Bilateral primary osteoarthritis of knee: Secondary | ICD-10-CM

## 2017-10-18 DIAGNOSIS — Z823 Family history of stroke: Secondary | ICD-10-CM | POA: Diagnosis not present

## 2017-10-18 DIAGNOSIS — Z5181 Encounter for therapeutic drug level monitoring: Secondary | ICD-10-CM | POA: Diagnosis not present

## 2017-10-18 DIAGNOSIS — F329 Major depressive disorder, single episode, unspecified: Secondary | ICD-10-CM | POA: Diagnosis not present

## 2017-10-18 DIAGNOSIS — Z8261 Family history of arthritis: Secondary | ICD-10-CM | POA: Insufficient documentation

## 2017-10-18 DIAGNOSIS — M19071 Primary osteoarthritis, right ankle and foot: Secondary | ICD-10-CM | POA: Insufficient documentation

## 2017-10-18 DIAGNOSIS — Z8619 Personal history of other infectious and parasitic diseases: Secondary | ICD-10-CM

## 2017-10-18 DIAGNOSIS — Z96619 Presence of unspecified artificial shoulder joint: Secondary | ICD-10-CM | POA: Insufficient documentation

## 2017-10-18 DIAGNOSIS — Z86010 Personal history of colonic polyps: Secondary | ICD-10-CM | POA: Diagnosis not present

## 2017-10-18 DIAGNOSIS — Z96659 Presence of unspecified artificial knee joint: Secondary | ICD-10-CM | POA: Insufficient documentation

## 2017-10-18 DIAGNOSIS — Z833 Family history of diabetes mellitus: Secondary | ICD-10-CM | POA: Diagnosis not present

## 2017-10-18 DIAGNOSIS — M19072 Primary osteoarthritis, left ankle and foot: Secondary | ICD-10-CM | POA: Insufficient documentation

## 2017-10-18 DIAGNOSIS — M19041 Primary osteoarthritis, right hand: Secondary | ICD-10-CM

## 2017-10-18 DIAGNOSIS — J45909 Unspecified asthma, uncomplicated: Secondary | ICD-10-CM | POA: Insufficient documentation

## 2017-10-18 DIAGNOSIS — G8929 Other chronic pain: Secondary | ICD-10-CM | POA: Diagnosis present

## 2017-10-18 DIAGNOSIS — Z90710 Acquired absence of both cervix and uterus: Secondary | ICD-10-CM | POA: Insufficient documentation

## 2017-10-18 DIAGNOSIS — M797 Fibromyalgia: Secondary | ICD-10-CM

## 2017-10-18 DIAGNOSIS — M19042 Primary osteoarthritis, left hand: Secondary | ICD-10-CM | POA: Diagnosis not present

## 2017-10-18 DIAGNOSIS — M069 Rheumatoid arthritis, unspecified: Secondary | ICD-10-CM | POA: Diagnosis not present

## 2017-10-18 DIAGNOSIS — Z8249 Family history of ischemic heart disease and other diseases of the circulatory system: Secondary | ICD-10-CM | POA: Insufficient documentation

## 2017-10-18 DIAGNOSIS — G894 Chronic pain syndrome: Secondary | ICD-10-CM | POA: Diagnosis not present

## 2017-10-18 MED ORDER — GABAPENTIN 100 MG PO CAPS: 100.0000 mg | ORAL_CAPSULE | Freq: Three times a day (TID) | ORAL | 3 refills | Status: DC

## 2017-10-18 MED ORDER — DICLOFENAC SODIUM 75 MG PO TBEC: 75.0000 mg | DELAYED_RELEASE_TABLET | Freq: Two times a day (BID) | ORAL | 3 refills | Status: DC

## 2017-10-18 NOTE — Progress Notes (Signed)
Please notify patient that the total body bone scan is consistent with osteoarthritis only.

## 2017-10-18 NOTE — Progress Notes (Signed)
Subjective:    Patient ID: Julie Kramer, female    DOB: 1965/06/28, 52 y.o.   MRN: [id number]  HPI   Julie Kramer is here for assessment of chronic pain. She currently has pain in her bilateral knees due to osteoarthritis which is followed by [place] [facility]. She has had numerous knee injections and at one point several years ago when she was in [place] had arthroscopic surgery on her right knee. Additionally she had C3-4 ACDF due to a HNP in 2014. She complains of bilateral hand pain and has a history of CTS with a history of release in the 1990's. However, she has been told she has "rheumatism" currently. The thumb appears to be most affected. 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 Corliss Skains although she was diagnosed with chronic Lyme disease and RA as well.   For pain she has had injections, used topical creams, supplements, OTC ibuprofen, goody powder and naproxen,  for pain relief. She also has had muscle relaxants too.   More than anything, she is having diffuse body pain which affects her every day movements and sleep. She feels tired most of the time. She feels very drained physically and emotionally. She tries to do some walking around her apartment but is limited due to pain. Her leisure activities are limited also.     Pain Inventory Average Pain 9 Pain Right Now 8 My pain is aching  In the last 24 hours, has pain interfered with the following? General activity 10 Relation with others 6 Enjoyment of life 10 What TIME of day is your pain at its worst? morning Sleep (in general) Poor  Pain is worse with: walking, bending, sitting, standing and some activites Pain improves with: na Relief from Meds: 1  Mobility walk without assistance ability to climb steps?  no do you drive?  yes  Function employed # of hrs/week 40  Neuro/Psych No problems in this area  Prior Studies new patient  Physicians involved in your care new  patient   Family History  Problem Relation Age of Onset  . Heart disease Mother   . Arthritis Father   . Heart disease Father   . Stroke Father   . Diabetes Father   . Hypertension Father   . Heart attack Father   . Diabetes Brother   . Stroke Brother   . Diabetes Brother   . Stroke Brother   . Stroke Sister   . Diabetes Sister   . Stroke Paternal Aunt   . Hypertension Paternal Aunt   . Diabetes Paternal Aunt   . Stroke Paternal Uncle   . Hypertension Paternal Uncle   . Diabetes Paternal Uncle   . Arthritis Maternal Grandmother   . Arthritis Maternal Grandfather   . Arthritis Paternal Grandmother   . Arthritis Paternal Grandfather    Social History   Socioeconomic History  . Marital status: Single    Spouse name: Not on file  . Number of children: Not on file  . Years of education: Not on file  . Highest education level: Not on file  Occupational History  . Not on file  Social Needs  . Financial resource strain: Not on file  . Food insecurity:    Worry: Not on file    Inability: Not on file  . Transportation needs:    Medical: Not on file    Non-medical: Not on file  Tobacco Use  . Smoking status: Never Smoker  . Smokeless tobacco:  Never Used  Substance and Sexual Activity  . Alcohol use: Yes    Comment: RARELY  . Drug use: No  . Sexual activity: Not on file  Lifestyle  . Physical activity:    Days per week: Not on file    Minutes per session: Not on file  . Stress: Not on file  Relationships  . Social connections:    Talks on phone: Not on file    Gets together: Not on file    Attends religious service: Not on file    Active member of club or organization: Not on file    Attends meetings of clubs or organizations: Not on file    Relationship status: Not on file  Other Topics Concern  . Not on file  Social History Narrative   2 children, both grown one lives in [place] (son) he has 3 children, and one in [place] (son)   Works for [employer] in  [employer].   Single,  Completed 12th grade   Has a dog.    Enjoys relaxing, sleeping   Past Surgical History:  Procedure Laterality Date  . ABDOMINAL HYSTERECTOMY  1990s  . CARPAL TUNNEL RELEASE    . KNEE ARTHROPLASTY    . SPINE SURGERY    . TONSILLECTOMY  1990s  . TOTAL SHOULDER ARTHROPLASTY     Past Medical History:  Diagnosis Date  . 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 Allergy   . Asthma   . Blood transfusion without reported diagnosis 1990s  . Depression   . Fibromyalgia   . History of chicken pox   . History of colon polyps   . History of migraine   . Lyme disease   . Rheumatoid arthritis (HCC)    BP 114/83   Pulse 89   Ht 5\' 2"  (1.575 m)   Wt 210 lb 3.2 oz (95.3 kg)   SpO2 97%   BMI 38.45 kg/m   Opioid Risk Score:   Fall Risk Score:  `1  Depression screen PHQ 2/9  No flowsheet data found.   Review of Systems     Objective:   Physical Exam    General: Alert and oriented x 3, No apparent distress.  Obese HEENT: Head is normocephalic, atraumatic, PERRLA, EOMI, sclera anicteric, oral mucosa pink and moist, dentition intact, ext ear canals clear,  Neck: Supple without JVD or lymphadenopathy Heart: Reg rate and rhythm. No murmurs rubs or gallops Chest: CTA bilaterally without wheezes, rales, or rhonchi; no distress Abdomen: Soft, non-tender, non-distended, bowel sounds positive. Extremities: No clubbing, cyanosis, or edema. Pulses are 2+ Skin: Clean and intact without signs of breakdown Neuro: Pt is cognitively appropriate with normal insight, memory, and awareness. Cranial nerves 2-12 are intact. Sensory exam is normal. Reflexes are 2+ in all 4's.  Decreased fine motor coordination. No tremors. Motor function is grossly except for there is some pain inhibition..  Musculoskeletal: Patient with tenderness to palpation in numerous areas including her suboccipital region both shoulders back hips elbows knees and feet.   She had noticeable bilateral CMC pain with palpation.  Finkelstein's test was equivocal.  She had difficulty gripping bilaterally.  Right and left knees both with crepitus during range of motion.  Right knee with effusion noted.  No frank knee instability was seen.  She does demonstrate significant antalgia right more than left with gait partially upon initial weightbearing after transfer.  Low back and cervical spine with some  tenderness to gentle range of motion.  No obvious joint abnormalities were seen today. Psych: Patient is pleasant but sometimes tearful and anxious.         Assessment & Plan:  1. 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 OA of bilateral knees, hands, feet. Bilateral CMC's most affected in hands 3. Depression   Plan: 1.  Extensive time was spent reviewing case including pertinent imaging.  Improving her quality of life will be multifaceted.  I discussed the fact that she needs to increase her physical activity as possible.  She has access to a pool as well as a gym and she should pursue low impact activities which could help with chronic pain and fibromyalgia. 2.  Begin trial of diclofenac 75 mg twice daily with food 3.  Begin trial of gabapentin 100 mg 3 times daily titrating up over 4 days to that dose. 4.  Patient was sent for urine drug screen today in the event we should choose to offer narcotic treatment for her pain. 5.  Consider medication to assist with sleep, particularly a tricyclic antidepressant may be helpful. 6.  Needs continue follow-up with orthopedic surgery for considerations for her knees. 7.  Spent 45 minutes with patient today in direct consultation and treatment.  I will see her back in about 1 month's time.  All questions were encouraged and answered.

## 2017-10-18 NOTE — Patient Instructions (Addendum)
GABAPENTIN: 100MG  AT NIGHT FOR 2 DAYS, THEN  100MG  TWICE DAILY FOR 2 DAYS,  THEN 100MG  THREE X DAILY   PLEASE FEEL FREE TO CALL OUR OFFICE WITH ANY PROBLEMS OR QUESTIONS [phone])

## 2017-10-26 LAB — TOXASSURE SELECT,+ANTIDEPR,UR

## 2017-10-28 ENCOUNTER — Telehealth: Payer: Self-pay | Admitting: *Deleted

## 2017-10-28 NOTE — Telephone Encounter (Signed)
Urine drug screen is appropriately negative for controlled substances, but is positive for ETOH.

## 2017-10-28 NOTE — Telephone Encounter (Signed)
We are not committed to narcotic rx at this point. etoh can be discussed with her at her follow up OV.

## 2017-11-16 ENCOUNTER — Encounter
Payer: BLUE CROSS/BLUE SHIELD | Attending: Physical Medicine & Rehabilitation | Admitting: Physical Medicine & Rehabilitation

## 2017-11-16 ENCOUNTER — Encounter: Payer: Self-pay | Admitting: Physical Medicine & Rehabilitation

## 2017-11-16 VITALS — BP 125/74 | HR 85 | Resp 14 | Ht 62.0 in | Wt 210.0 lb

## 2017-11-16 DIAGNOSIS — M069 Rheumatoid arthritis, unspecified: Secondary | ICD-10-CM | POA: Diagnosis not present

## 2017-11-16 DIAGNOSIS — J45909 Unspecified asthma, uncomplicated: Secondary | ICD-10-CM | POA: Insufficient documentation

## 2017-11-16 DIAGNOSIS — M19072 Primary osteoarthritis, left ankle and foot: Secondary | ICD-10-CM | POA: Diagnosis not present

## 2017-11-16 DIAGNOSIS — G894 Chronic pain syndrome: Secondary | ICD-10-CM | POA: Diagnosis not present

## 2017-11-16 DIAGNOSIS — Z833 Family history of diabetes mellitus: Secondary | ICD-10-CM | POA: Diagnosis not present

## 2017-11-16 DIAGNOSIS — M19042 Primary osteoarthritis, left hand: Secondary | ICD-10-CM | POA: Insufficient documentation

## 2017-11-16 DIAGNOSIS — M170 Bilateral primary osteoarthritis of knee: Secondary | ICD-10-CM | POA: Insufficient documentation

## 2017-11-16 DIAGNOSIS — Z96659 Presence of unspecified artificial knee joint: Secondary | ICD-10-CM | POA: Insufficient documentation

## 2017-11-16 DIAGNOSIS — F329 Major depressive disorder, single episode, unspecified: Secondary | ICD-10-CM | POA: Insufficient documentation

## 2017-11-16 DIAGNOSIS — Z96619 Presence of unspecified artificial shoulder joint: Secondary | ICD-10-CM | POA: Diagnosis not present

## 2017-11-16 DIAGNOSIS — Z823 Family history of stroke: Secondary | ICD-10-CM | POA: Insufficient documentation

## 2017-11-16 DIAGNOSIS — M19071 Primary osteoarthritis, right ankle and foot: Secondary | ICD-10-CM | POA: Insufficient documentation

## 2017-11-16 DIAGNOSIS — Z8249 Family history of ischemic heart disease and other diseases of the circulatory system: Secondary | ICD-10-CM | POA: Insufficient documentation

## 2017-11-16 DIAGNOSIS — G8929 Other chronic pain: Secondary | ICD-10-CM | POA: Diagnosis present

## 2017-11-16 DIAGNOSIS — Z8619 Personal history of other infectious and parasitic diseases: Secondary | ICD-10-CM

## 2017-11-16 DIAGNOSIS — Z86010 Personal history of colonic polyps: Secondary | ICD-10-CM | POA: Insufficient documentation

## 2017-11-16 DIAGNOSIS — Z90710 Acquired absence of both cervix and uterus: Secondary | ICD-10-CM | POA: Diagnosis not present

## 2017-11-16 DIAGNOSIS — Z8261 Family history of arthritis: Secondary | ICD-10-CM | POA: Diagnosis not present

## 2017-11-16 DIAGNOSIS — M19041 Primary osteoarthritis, right hand: Secondary | ICD-10-CM | POA: Insufficient documentation

## 2017-11-16 DIAGNOSIS — M797 Fibromyalgia: Secondary | ICD-10-CM | POA: Diagnosis not present

## 2017-11-16 MED ORDER — GABAPENTIN 100 MG PO CAPS: 200.0000 mg | ORAL_CAPSULE | Freq: Three times a day (TID) | ORAL | 3 refills | Status: DC

## 2017-11-16 MED ORDER — AMITRIPTYLINE HCL 10 MG PO TABS: 10.0000 mg | ORAL_TABLET | Freq: Every day | ORAL | 2 refills | Status: DC

## 2017-11-16 NOTE — Patient Instructions (Addendum)
TRY 10MG  OF AMITRIPTYLINE AT NIGHT FOR 1 WEEK, IF NO BENEFIT, THEN INCREASE TO 20MG .   MAINTAIN AEROBIC EXERCISE AND ACTIVITY.

## 2017-11-16 NOTE — Progress Notes (Signed)
Subjective:    Patient ID: Julie Kramer, female    DOB: Jan 14, 1966, 52 y.o.   MRN: [id number]  HPI  Mrs Julie Kramer is here in follow up of her chronic pain. She felt improvement in her pain with the changes we made at last visit. She is still having pain at night which is generally whole body in nature which makes it hard to sleep or to find a comfortable position.  She has tolerated the gabapentin and diclofenac without issues currently.  She is trying to maintain his goal activity to tolerance.  I addressed the positive alcohol reading in her urine drug screen.  Patient stated that she had been having a beer at nighttime on occasion to help her sleep.  She often did not finish even 1 beer.  Pain Inventory Average Pain 8 Pain Right Now 8 My pain is aching  In the last 24 hours, has pain interfered with the following? General activity 8 Relation with others 5 Enjoyment of life 8 What TIME of day is your pain at its worst? morning, night  Sleep (in general) Poor  Pain is worse with: bending, sitting, inactivity and some activites Pain improves with: rest Relief from Meds: 7  Mobility walk without assistance how many minutes can you walk? 20-30 ability to climb steps?  yes do you drive?  yes transfers alone Do you have any goals in this area?  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  yes  Neuro/Psych No problems in this area  Prior Studies Any changes since last visit?  no  Physicians involved in your care Any changes since last visit?  no   Family History  Problem Relation Age of Onset  . Heart disease Mother   . Arthritis Father   . Heart disease Father   . Stroke Father   . Diabetes Father   . Hypertension Father   . Heart attack Father   . Diabetes Brother   . Stroke Brother   . Diabetes Brother   . Stroke Brother   . Stroke Sister   . Diabetes Sister   . Stroke Paternal  Aunt   . Hypertension Paternal Aunt   . Diabetes Paternal Aunt   . Stroke Paternal Uncle   . Hypertension Paternal Uncle   . Diabetes Paternal Uncle   . Arthritis Maternal Grandmother   . Arthritis Maternal Grandfather   . Arthritis Paternal Grandmother   . Arthritis Paternal Grandfather    Social History   Socioeconomic History  . Marital status: Single    Spouse name: Not on file  . Number of children: Not on file  . Years of education: Not on file  . Highest education level: Not on file  Occupational History  . Not on file  Social Needs  . Financial resource strain: Not on file  . Food insecurity:    Worry: Not on file    Inability: Not on file  . Transportation needs:    Medical: Not on file    Non-medical: Not on file  Tobacco Use  . Smoking status: Never Smoker  . Smokeless tobacco: Never Used  Substance and Sexual Activity  . Alcohol use: Yes    Comment: RARELY  . Drug use: No  . Sexual activity: Not on file  Lifestyle  . Physical activity:    Days per week: Not on file    Minutes per session:  Not on file  . Stress: Not on file  Relationships  . Social connections:    Talks on phone: Not on file    Gets together: Not on file    Attends religious service: Not on file    Active member of club or organization: Not on file    Attends meetings of clubs or organizations: Not on file    Relationship status: Not on file  Other Topics Concern  . Not on file  Social History Narrative   2 children, both grown one lives in [place] (son) he has 3 children, and one in [place] (son)   Works for [employer] in [employer].   Single,  Completed 12th grade   Has a dog.    Enjoys relaxing, sleeping   Past Surgical History:  Procedure Laterality Date  . ABDOMINAL HYSTERECTOMY  1990s  . CARPAL TUNNEL RELEASE    . KNEE ARTHROPLASTY    . SPINE SURGERY    . TONSILLECTOMY  1990s  . TOTAL SHOULDER ARTHROPLASTY     Past Medical History:  Diagnosis Date  . 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 Allergy   . Asthma   . Blood transfusion without reported diagnosis 1990s  . Depression   . Fibromyalgia   . History of chicken pox   . History of colon polyps   . History of migraine   . Lyme disease   . Rheumatoid arthritis (HCC)    BP 125/74   Pulse 85   Resp 14   Ht 5\' 2"  (1.575 m)   Wt 210 lb (95.3 kg)   BMI 38.41 kg/m   Opioid Risk Score:   Fall Risk Score:  `1  Depression screen PHQ 2/9  Depression screen PHQ 2/9 10/18/2017  Decreased Interest 0  Down, Depressed, Hopeless 2  PHQ - 2 Score 2  Altered sleeping 3  Tired, decreased energy 3  Change in appetite 3  Feeling bad or failure about yourself  3  Trouble concentrating 0  Moving slowly or fidgety/restless 0  Suicidal thoughts 0  PHQ-9 Score 14  Difficult doing work/chores Somewhat difficult    Review of Systems  Constitutional: Negative.   HENT: Negative.   Eyes: Negative.   Respiratory: Negative.   Cardiovascular: Negative.   Gastrointestinal: Positive for constipation and diarrhea.  Endocrine: Negative.   Genitourinary: Negative.   Musculoskeletal: Positive for arthralgias, back pain, neck pain and neck stiffness.  Skin: Negative.   Allergic/Immunologic: Negative.   Neurological: Negative.   Hematological: Negative.   All other systems reviewed and are negative.      Objective:   Physical Exam  General: No acute distress, obese HEENT: EOMI, oral membranes moist Cards: reg rate  Chest: normal effort Abdomen: Soft, NT, ND Skin: dry, intact Extremities: no edema Neuro: Pt is cognitively appropriate with normal insight, memory, and awareness. Cranial nerves 2-12 are intact. Sensory exam is normal. Reflexes are 2+ in all 4's.  Decreased fine motor coordination. No tremors. Motor function is grossly except for there is some pain inhibition..  Musculoskeletal:  Patient was general tenderness in the neck shoulders hips and  elbows.  She has pain in bilateral knees with crepitus noted more so on the right and left.  She has functional cervical and lumbar range of motion. Psych:  Patient very pleasant and appropriate        Assessment & Plan:  1. Chronic pain syndrome c/w chronic lyme  disease/fibromyalgia 2. OA of bilateral knees, hands, feet. Bilateral CMC's most affected in hands 3. Depression   Plan: 1.  Will add low dose elavil for sleep, 10-20MG  at hs.  2. continue diclofenac 75 mg twice daily with food 3.  Increase gabapentin to 200 mg 3 times daily   4.  Reviewed use of Etoh with medications on board. Patient understands and states that she really hardly drinks and had been having a beer on occasion to help her rest. 5. Increase physical activity as possible 6.  Needs continue follow-up with orthopedic surgery for considerations for her knees. 7.  15 minutes with patient today in direct consultation and treatment.  I will see her back in about 2 month's time.  All questions were encouraged and answered.

## 2017-12-08 ENCOUNTER — Other Ambulatory Visit: Payer: Self-pay | Admitting: Physical Medicine & Rehabilitation

## 2017-12-08 DIAGNOSIS — Z8619 Personal history of other infectious and parasitic diseases: Secondary | ICD-10-CM

## 2017-12-08 DIAGNOSIS — M797 Fibromyalgia: Secondary | ICD-10-CM

## 2017-12-19 NOTE — Progress Notes (Deleted)
Office Visit Note  Patient: Julie Kramer             Date of Birth: 12-20-1965           MRN: [id number]             PCP: Sandford Craze, NP Referring: Sandford Craze, NP Visit Date: 12/28/2017 Occupation: @GUAROCC @  Subjective:  No chief complaint on file.   History of Present Illness: Kambryn Dapolito is a 52 y.o. female ***   Activities of Daily Living:  Patient reports morning stiffness for *** {minute/hour:19697}.   Patient {ACTIONS;DENIES/REPORTS:21021675::"Denies"} nocturnal pain.  Difficulty dressing/grooming: {ACTIONS;DENIES/REPORTS:21021675::"Denies"} Difficulty climbing stairs: {ACTIONS;DENIES/REPORTS:21021675::"Denies"} Difficulty getting out of chair: {ACTIONS;DENIES/REPORTS:21021675::"Denies"} Difficulty using hands for taps, buttons, cutlery, and/or writing: {ACTIONS;DENIES/REPORTS:21021675::"Denies"}  No Rheumatology ROS completed.   PMFS History:  Patient Active Problem List   Diagnosis Date Noted  . History of Lyme disease 09/30/2016  . Plantar fasciitis 09/30/2016  . Primary osteoarthritis of both knees 05/05/2016  . Primary osteoarthritis of both hands 05/05/2016  . Primary osteoarthritis of both feet 05/05/2016  . Fibromyalgia 05/05/2016  . History of asthma 05/05/2016  . Myalgia 01/08/2016  . Hand pain 01/08/2016  . Other fatigue 01/08/2016  . Preventative health care 08/13/2015  . Rheumatoid factor positive 08/13/2015  . Lyme disease 07/22/2015  . Asthma 06/17/2015  . Other insomnia 06/17/2015    Past Medical History:  Diagnosis Date  . 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 Allergy   . Asthma   . Blood transfusion without reported diagnosis 1990s  . Depression   . Fibromyalgia   . History of chicken pox   . History of colon polyps   . History of migraine   . Lyme disease   . Rheumatoid arthritis (HCC)     Family History  Problem Relation Age of Onset  . Heart disease Mother     . Arthritis Father   . Heart disease Father   . Stroke Father   . Diabetes Father   . Hypertension Father   . Heart attack Father   . Diabetes Brother   . Stroke Brother   . Diabetes Brother   . Stroke Brother   . Stroke Sister   . Diabetes Sister   . Stroke Paternal Aunt   . Hypertension Paternal Aunt   . Diabetes Paternal Aunt   . Stroke Paternal Uncle   . Hypertension Paternal Uncle   . Diabetes Paternal Uncle   . Arthritis Maternal Grandmother   . Arthritis Maternal Grandfather   . Arthritis Paternal Grandmother   . Arthritis Paternal Grandfather    Past Surgical History:  Procedure Laterality Date  . ABDOMINAL HYSTERECTOMY  1990s  . CARPAL TUNNEL RELEASE    . KNEE ARTHROPLASTY    . SPINE SURGERY    . TONSILLECTOMY  1990s  . TOTAL SHOULDER ARTHROPLASTY     Social History   Social History Narrative   2 children, both grown one lives in [place]19/2015 (son) he has 3 children, and one in Prison (son)   Works for [place] in [employer].   Single,  Completed 12th grade   Has a dog.    Enjoys relaxing, sleeping    Objective: Vital Signs: There were no vitals taken for this visit.   Physical Exam   Musculoskeletal Exam: ***  CDAI Exam: CDAI Score: Not documented Patient Global Assessment: Not documented; Provider Global Assessment: Not documented Swollen: Not documented; Tender: Not documented  Joint Exam   Not documented   There is currently no information documented on the homunculus. Go to the Rheumatology activity and complete the homunculus joint exam.  Investigation: No additional findings.  Imaging: No results found.  Recent Labs: Lab Results  Component Value Date   WBC 8.3 06/15/2017   HGB 12.4 06/15/2017   PLT 259 06/15/2017   NA 141 06/15/2017   K 4.4 06/15/2017   CL 108 06/15/2017   CO2 27 06/15/2017   GLUCOSE 91 06/15/2017   BUN 15 06/15/2017   CREATININE 0.73 06/15/2017   BILITOT 0.6 06/15/2017   ALKPHOS 127 (H) 08/12/2015    AST 36 (H) 06/15/2017   ALT 41 (H) 06/15/2017   PROT 6.8 06/15/2017   ALBUMIN 4.0 08/12/2015   CALCIUM 9.5 06/15/2017   GFRAA 109 06/15/2017    Speciality Comments: No specialty comments available.  Procedures:  No procedures performed Allergies: Hydrocodone; Iodine; Morphine and related; and Codeine   Assessment / Plan:     Visit Diagnoses: Inflammatory arthritis - RF+,CCP-, 14-3-3 eta, u/s on 06/15/17- showed mild hyperemia in the right wrist joint otherwise unremarkable  Primary osteoarthritis of both hands  Primary osteoarthritis of both knees  Primary osteoarthritis of both feet  Plantar fasciitis  Fibromyalgia  Other fatigue  Other insomnia  History of asthma  History of Lyme disease   Orders: No orders of the defined types were placed in this encounter.  No orders of the defined types were placed in this encounter.   Face-to-face time spent with patient was *** minutes. Greater than 50% of time was spent in counseling and coordination of care.  Follow-Up Instructions: No follow-ups on file.   Gearldine Bienenstock, PA-C  Note - This record has been created using Dragon software.  Chart creation errors have been sought, but may not always  have been located. Such creation errors do not reflect on  the standard of medical care.

## 2017-12-28 ENCOUNTER — Ambulatory Visit: Payer: BLUE CROSS/BLUE SHIELD | Admitting: Physician Assistant

## 2018-01-13 ENCOUNTER — Other Ambulatory Visit: Payer: Self-pay | Admitting: Physical Medicine & Rehabilitation

## 2018-01-13 DIAGNOSIS — M797 Fibromyalgia: Secondary | ICD-10-CM

## 2018-01-13 DIAGNOSIS — Z8619 Personal history of other infectious and parasitic diseases: Secondary | ICD-10-CM

## 2018-01-23 ENCOUNTER — Encounter
Payer: BLUE CROSS/BLUE SHIELD | Attending: Physical Medicine & Rehabilitation | Admitting: Physical Medicine & Rehabilitation

## 2018-01-23 DIAGNOSIS — Z86010 Personal history of colonic polyps: Secondary | ICD-10-CM | POA: Insufficient documentation

## 2018-01-23 DIAGNOSIS — Z8261 Family history of arthritis: Secondary | ICD-10-CM | POA: Insufficient documentation

## 2018-01-23 DIAGNOSIS — Z90710 Acquired absence of both cervix and uterus: Secondary | ICD-10-CM | POA: Insufficient documentation

## 2018-01-23 DIAGNOSIS — J45909 Unspecified asthma, uncomplicated: Secondary | ICD-10-CM | POA: Insufficient documentation

## 2018-01-23 DIAGNOSIS — M797 Fibromyalgia: Secondary | ICD-10-CM | POA: Insufficient documentation

## 2018-01-23 DIAGNOSIS — M19041 Primary osteoarthritis, right hand: Secondary | ICD-10-CM | POA: Insufficient documentation

## 2018-01-23 DIAGNOSIS — M19042 Primary osteoarthritis, left hand: Secondary | ICD-10-CM | POA: Insufficient documentation

## 2018-01-23 DIAGNOSIS — F329 Major depressive disorder, single episode, unspecified: Secondary | ICD-10-CM | POA: Insufficient documentation

## 2018-01-23 DIAGNOSIS — M069 Rheumatoid arthritis, unspecified: Secondary | ICD-10-CM | POA: Insufficient documentation

## 2018-01-23 DIAGNOSIS — G894 Chronic pain syndrome: Secondary | ICD-10-CM | POA: Insufficient documentation

## 2018-01-23 DIAGNOSIS — M19072 Primary osteoarthritis, left ankle and foot: Secondary | ICD-10-CM | POA: Insufficient documentation

## 2018-01-23 DIAGNOSIS — Z823 Family history of stroke: Secondary | ICD-10-CM | POA: Insufficient documentation

## 2018-01-23 DIAGNOSIS — M170 Bilateral primary osteoarthritis of knee: Secondary | ICD-10-CM | POA: Insufficient documentation

## 2018-01-23 DIAGNOSIS — Z8249 Family history of ischemic heart disease and other diseases of the circulatory system: Secondary | ICD-10-CM | POA: Insufficient documentation

## 2018-01-23 DIAGNOSIS — Z96619 Presence of unspecified artificial shoulder joint: Secondary | ICD-10-CM | POA: Insufficient documentation

## 2018-01-23 DIAGNOSIS — M19071 Primary osteoarthritis, right ankle and foot: Secondary | ICD-10-CM | POA: Insufficient documentation

## 2018-01-23 DIAGNOSIS — Z833 Family history of diabetes mellitus: Secondary | ICD-10-CM | POA: Insufficient documentation

## 2018-01-23 DIAGNOSIS — Z96659 Presence of unspecified artificial knee joint: Secondary | ICD-10-CM | POA: Insufficient documentation

## 2018-02-27 ENCOUNTER — Other Ambulatory Visit: Payer: Self-pay | Admitting: Physical Medicine & Rehabilitation

## 2018-02-27 DIAGNOSIS — Z8619 Personal history of other infectious and parasitic diseases: Secondary | ICD-10-CM

## 2018-02-27 DIAGNOSIS — M797 Fibromyalgia: Secondary | ICD-10-CM

## 2018-02-27 DIAGNOSIS — M170 Bilateral primary osteoarthritis of knee: Secondary | ICD-10-CM

## 2018-03-06 ENCOUNTER — Other Ambulatory Visit: Payer: Self-pay | Admitting: Physical Medicine & Rehabilitation

## 2018-03-06 DIAGNOSIS — Z8619 Personal history of other infectious and parasitic diseases: Secondary | ICD-10-CM

## 2018-03-06 DIAGNOSIS — M797 Fibromyalgia: Secondary | ICD-10-CM

## 2018-03-08 ENCOUNTER — Other Ambulatory Visit: Payer: Self-pay | Admitting: Physical Medicine & Rehabilitation

## 2018-03-08 DIAGNOSIS — M797 Fibromyalgia: Secondary | ICD-10-CM

## 2018-03-08 DIAGNOSIS — Z8619 Personal history of other infectious and parasitic diseases: Secondary | ICD-10-CM

## 2018-04-25 ENCOUNTER — Other Ambulatory Visit: Payer: Self-pay | Admitting: Physical Medicine & Rehabilitation

## 2018-04-25 DIAGNOSIS — Z8619 Personal history of other infectious and parasitic diseases: Secondary | ICD-10-CM

## 2018-04-25 DIAGNOSIS — M797 Fibromyalgia: Secondary | ICD-10-CM

## 2018-04-29 ENCOUNTER — Encounter (HOSPITAL_BASED_OUTPATIENT_CLINIC_OR_DEPARTMENT_OTHER): Payer: Self-pay | Admitting: Adult Health

## 2018-04-29 ENCOUNTER — Emergency Department (HOSPITAL_BASED_OUTPATIENT_CLINIC_OR_DEPARTMENT_OTHER): Payer: BLUE CROSS/BLUE SHIELD

## 2018-04-29 ENCOUNTER — Other Ambulatory Visit: Payer: Self-pay

## 2018-04-29 ENCOUNTER — Emergency Department (HOSPITAL_BASED_OUTPATIENT_CLINIC_OR_DEPARTMENT_OTHER)
Admission: EM | Admit: 2018-04-29 | Discharge: 2018-04-29 | Disposition: A | Discharge status: Home / Self Care | Payer: BLUE CROSS/BLUE SHIELD | Attending: Emergency Medicine | Admitting: Emergency Medicine

## 2018-04-29 DIAGNOSIS — A6920 Lyme disease, unspecified: Secondary | ICD-10-CM | POA: Insufficient documentation

## 2018-04-29 DIAGNOSIS — R0602 Shortness of breath: Secondary | ICD-10-CM | POA: Diagnosis not present

## 2018-04-29 DIAGNOSIS — J45909 Unspecified asthma, uncomplicated: Secondary | ICD-10-CM | POA: Insufficient documentation

## 2018-04-29 DIAGNOSIS — J209 Acute bronchitis, unspecified: Secondary | ICD-10-CM | POA: Diagnosis not present

## 2018-04-29 DIAGNOSIS — R05 Cough: Secondary | ICD-10-CM | POA: Diagnosis not present

## 2018-04-29 DIAGNOSIS — Z79899 Other long term (current) drug therapy: Secondary | ICD-10-CM | POA: Diagnosis not present

## 2018-04-29 MED ORDER — IBUPROFEN 400 MG PO TABS
600.0000 mg | ORAL_TABLET | Freq: Once | ORAL | Status: AC
  Administered 2018-04-29: 600 mg via ORAL
  Filled 2018-04-29: qty 1

## 2018-04-29 MED ORDER — ALBUTEROL SULFATE HFA 108 (90 BASE) MCG/ACT IN AERS: 2.0000 | INHALATION_SPRAY | RESPIRATORY_TRACT | Status: DC | PRN

## 2018-04-29 MED ORDER — ACETAMINOPHEN 325 MG PO TABS
650.0000 mg | ORAL_TABLET | Freq: Once | ORAL | Status: AC
  Administered 2018-04-29: 650 mg via ORAL
  Filled 2018-04-29: qty 2

## 2018-04-29 MED ORDER — ALBUTEROL SULFATE HFA 108 (90 BASE) MCG/ACT IN AERS
2.0000 | INHALATION_SPRAY | RESPIRATORY_TRACT | Status: DC
  Administered 2018-04-29: 2 via RESPIRATORY_TRACT
  Filled 2018-04-29 (×2): qty 6.7

## 2018-04-29 NOTE — ED Triage Notes (Signed)
PResents with cough since Tuesday associated with sputum production, fever, chest pain with coughing and headache. The cough is keeping her awake at night. She reports one episode of emesis Thursday. She states she has IBS and always has diarrhea. Her fever has been as high as 103 at home. She is taking theraflu and it is not helping. This began with a sore throat and has progressed since Tuesday. Unsure of sick contacts, she works at [employer] and interacts with public. Denies any travel.

## 2018-04-29 NOTE — ED Provider Notes (Signed)
[facility] EMERGENCY DEPARTMENT Provider Note   CSN: [id number] Arrival date & time: 04/29/18  0700    History   Chief Complaint Chief Complaint  Patient presents with  . Cough    HPI Julie Kramer is a 53 y.o. female.     HPI Patient is a 53 year old female with a history of asthma presents the emergency department with ongoing productive cough since Tuesday.  She reports pain in her chest with coughing and breathing.  She reports headache.  Chills and fever.  Reports a temp as high as 103 at home.  She is tried TheraFlu at approximately 1:30 AM without improvement in her symptoms.  She has mild sore throat.  She feels like this is progressed over the past several days.  No known sick contacts.  Denies abdominal pain.  Denies nausea at this time.  Reports one episode of vomiting Thursday associated with coughing.  Past Medical History:  Diagnosis Date  . 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 Allergy   . Asthma   . Blood transfusion without reported diagnosis 1990s  . Depression   . Fibromyalgia   . History of chicken pox   . History of colon polyps   . History of migraine   . Lyme disease   . Rheumatoid arthritis [facility])     Patient Active Problem List   Diagnosis Date Noted  . History of Lyme disease 09/30/2016  . Plantar fasciitis 09/30/2016  . Primary osteoarthritis of both knees 05/05/2016  . Primary osteoarthritis of both hands 05/05/2016  . Primary osteoarthritis of both feet 05/05/2016  . Fibromyalgia 05/05/2016  . History of asthma 05/05/2016  . Myalgia 01/08/2016  . Hand pain 01/08/2016  . Other fatigue 01/08/2016  . Preventative health care 08/13/2015  . Rheumatoid factor positive 08/13/2015  . Lyme disease 07/22/2015  . Asthma 06/17/2015  . Other insomnia 06/17/2015    Past Surgical History:  Procedure Laterality Date  . ABDOMINAL HYSTERECTOMY  1990s  . CARPAL TUNNEL RELEASE    . KNEE  ARTHROPLASTY    . SPINE SURGERY    . TONSILLECTOMY  1990s  . TOTAL SHOULDER ARTHROPLASTY       OB History   No obstetric history on file.      Home Medications    Prior to Admission medications   Medication Sig Start Date End Date Taking? Authorizing Provider  albuterol (PROVENTIL HFA;VENTOLIN HFA) 108 (90 Base) MCG/ACT inhaler Inhale into the lungs every 6 (six) hours as needed for wheezing or shortness of breath.    [provider]  amitriptyline (ELAVIL) 10 MG tablet TAKE 1-2 TABLETS (10-20 MG TOTAL) BY MOUTH AT BEDTIME. 01/13/18   Ranelle Oyster, MD  budesonide-formoterol [facility]) 160-4.5 MCG/ACT inhaler Inhale 2 puffs into the lungs 2 (two) times daily.    [provider]  diclofenac (VOLTAREN) 75 MG EC tablet TAKE 1 TABLET (75 MG TOTAL) BY MOUTH 2 (TWO) TIMES DAILY WITH A MEAL. 02/27/18   Ranelle Oyster, MD  fluticasone [facility]) 50 MCG/ACT nasal spray Place into the nose as needed.    [provider]  gabapentin (NEURONTIN) 100 MG capsule TAKE 1 CAPSULE BY MOUTH THREE TIMES A DAY 02/27/18   Ranelle Oyster, MD    Family History Family History  Problem Relation Age of Onset  . Heart disease Mother   . Arthritis Father   . Heart disease Father   . Stroke Father   .  Diabetes Father   . Hypertension Father   . Heart attack Father   . Diabetes Brother   . Stroke Brother   . Diabetes Brother   . Stroke Brother   . Stroke Sister   . Diabetes Sister   . Stroke Paternal Aunt   . Hypertension Paternal Aunt   . Diabetes Paternal Aunt   . Stroke Paternal Uncle   . Hypertension Paternal Uncle   . Diabetes Paternal Uncle   . Arthritis Maternal Grandmother   . Arthritis Maternal Grandfather   . Arthritis Paternal Grandmother   . Arthritis Paternal Grandfather     Social History Social History   Tobacco Use  . Smoking status: Never Smoker  . Smokeless tobacco: Never Used  Substance Use Topics  . Alcohol use: Yes    Comment: RARELY    . Drug use: No     Allergies   Hydrocodone; Iodine; Morphine and related; and Codeine   Review of Systems Review of Systems  All other systems reviewed and are negative.    Physical Exam Updated Vital Signs BP (!) 141/86 (BP Location: Right Arm)   Pulse 96   Temp 98.6 F (37 C) (Oral)   Resp 20   Ht $RemoveBefo [id number]$5\' 2"m   Physical Exam Vitals signs and nursing note reviewed.  Constitutional:      General: She is not in acute distress.    Appearance: She is well-developed.  HENT:     Head: Normocephalic and atraumatic.  Neck:     Musculoskeletal: Normal range of motion.  Cardiovascular:     Rate and Rhythm: Normal rate and regular rhythm.     Heart sounds: Normal heart sounds.  Pulmonary:     Effort: Pulmonary effort is normal.     Breath sounds: Normal breath sounds. No stridor. No wheezing, rhonchi or rales.  Abdominal:     General: There is no distension.     Palpations: Abdomen is soft.     Tenderness: There is no abdominal tenderness.  Musculoskeletal: Normal range of motion.  Skin:    General: Skin is warm and dry.  Neurological:     Mental Status: She is alert and oriented to person, place, and time.  Psychiatric:        Judgment: Judgment normal.      ED Treatments / Results  Labs (all labs ordered are listed, but only abnormal results are displayed) Labs Reviewed - No data to display  EKG None  Radiology Dg Chest 2 View  Result Date: 04/29/2018 CLINICAL DATA:  Cough, congestion, shortness of breath, and fever for the past 4 days. EXAM: CHEST - 2 VIEW COMPARISON:  Chest x-ray dated November 21, 2015. FINDINGS: The heart size and mediastinal contours are within normal limits. Both lungs are clear. The visualized skeletal structures are unremarkable. IMPRESSION: No active cardiopulmonary disease. Electronically Signed   By: Obie DredgeWilliam T Derry M.D.   On: 04/29/2018 08:38    Procedures Procedures (including  critical care time)  Medications Ordered in ED Medications  albuterol (PROVENTIL HFA;VENTOLIN HFA) 108 (90 Base) MCG/ACT inhaler 2 puff (has no administration in time range)  ibuprofen (ADVIL,MOTRIN) tablet 600 mg (600 mg Oral Given 04/29/18 0749)  acetaminophen (TYLENOL) tablet 650 mg (650 mg Oral Given 04/29/18 0749)     Initial Impression / Assessment and Plan / ED Course  I have reviewed the triage vital signs and the nursing  notes.  Pertinent labs & imaging results that were available during my care of the patient were reviewed by me and considered in my medical decision making (see chart for details).        8:49 AM Feels much better at this time.  Cough improved.  Chest x-ray without infiltrate.  Suspect viral bronchitis with upper respiratory bronchospasm.  No wheezing at this time.  Home with an albuterol inhaler and symptomatic management.  Close primary care follow-up.  Patient understands to return to the emergency department for new or worsening symptoms  Final Clinical Impressions(s) / ED Diagnoses   Final diagnoses:  Acute bronchitis with bronchospasm    ED Discharge Orders    None       Azalia Bilis, MD 04/29/18 [phone]

## 2018-04-29 NOTE — Progress Notes (Signed)
RT note: RT instructed patient with proper use of albuterol MDI and spacer using the teach back method. Patient showed good understanding and proper use.

## 2018-05-04 ENCOUNTER — Telehealth: Payer: Self-pay

## 2018-05-04 DIAGNOSIS — Z8619 Personal history of other infectious and parasitic diseases: Secondary | ICD-10-CM

## 2018-05-04 DIAGNOSIS — M797 Fibromyalgia: Secondary | ICD-10-CM

## 2018-05-04 MED ORDER — AMITRIPTYLINE HCL 10 MG PO TABS: 10.0000 mg | ORAL_TABLET | Freq: Every day | ORAL | 0 refills | Status: DC

## 2018-05-04 NOTE — Telephone Encounter (Signed)
Refill request from pharmacy. Sent one fill with no additional refills without an appointment

## 2018-05-09 ENCOUNTER — Encounter: Payer: Self-pay | Admitting: Family

## 2018-05-09 ENCOUNTER — Inpatient Hospital Stay: Payer: BLUE CROSS/BLUE SHIELD | Admitting: Family

## 2018-05-09 ENCOUNTER — Other Ambulatory Visit: Payer: Self-pay

## 2018-05-09 ENCOUNTER — Ambulatory Visit (HOSPITAL_BASED_OUTPATIENT_CLINIC_OR_DEPARTMENT_OTHER)
Admission: RE | Admit: 2018-05-09 | Discharge: 2018-05-09 | Disposition: A | Discharge status: Home / Self Care | Payer: BLUE CROSS/BLUE SHIELD | Source: Ambulatory Visit | Attending: Family | Admitting: Family

## 2018-05-09 ENCOUNTER — Ambulatory Visit (INDEPENDENT_AMBULATORY_CARE_PROVIDER_SITE_OTHER): Payer: BLUE CROSS/BLUE SHIELD | Admitting: Family

## 2018-05-09 VITALS — BP 126/78 | HR 100 | Temp 98.4°F | Resp 16 | Ht 62.0 in | Wt 214.0 lb

## 2018-05-09 DIAGNOSIS — J101 Influenza due to other identified influenza virus with other respiratory manifestations: Secondary | ICD-10-CM

## 2018-05-09 DIAGNOSIS — R059 Cough, unspecified: Secondary | ICD-10-CM

## 2018-05-09 DIAGNOSIS — R05 Cough: Secondary | ICD-10-CM | POA: Insufficient documentation

## 2018-05-09 MED ORDER — PREDNISONE 10 MG PO TABS: ORAL_TABLET | ORAL | 0 refills | Status: DC

## 2018-05-09 MED ORDER — BENZONATATE 100 MG PO CAPS: 100.0000 mg | ORAL_CAPSULE | Freq: Three times a day (TID) | ORAL | 0 refills | Status: DC | PRN

## 2018-05-09 MED ORDER — LEVOFLOXACIN 500 MG PO TABS: 500.0000 mg | ORAL_TABLET | Freq: Every day | ORAL | 0 refills | Status: DC

## 2018-05-09 NOTE — Progress Notes (Signed)
Subjective:    Patient ID: Julie Kramer, female    DOB: [birth year], 53 y.o.   MRN: [id number]  HPI  Patient is a 53 yr old female who presents today with chief complaint of cough. She was seen on 04/29/18 in the ED for the same.  She had tmax of 103 prior to her ED visit.  She underwent CXR at that time.  CXR was negative for infiltrate. She was advised to follow up with primary care.    She reports that she had a fever Saturday evening of 103 with chills.  Cough is dry, reports + SOB. Trouble sleeping due to cough.  Denies recent travel.  Hurts to cough. Feels exhausted.  She did have a flu shot this season.   .Review of Systems See HPI  Past Medical History:  Diagnosis Date  . 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 Allergy   . Asthma   . Blood transfusion without reported diagnosis 1990s  . Depression   . Fibromyalgia   . History of chicken pox   . History of colon polyps   . History of migraine   . Lyme disease   . Rheumatoid arthritis (HCC)      Social History   Socioeconomic History  . Marital status: Single    Spouse name: Not on file  . Number of children: Not on file  . Years of education: Not on file  . Highest education level: Not on file  Occupational History  . Not on file  Social Needs  . Financial resource strain: Not on file  . Food insecurity:    Worry: Not on file    Inability: Not on file  . Transportation needs:    Medical: Not on file    Non-medical: Not on file  Tobacco Use  . Smoking status: Never Smoker  . Smokeless tobacco: Never Used  Substance and Sexual Activity  . Alcohol use: Yes    Comment: RARELY  . Drug use: No  . Sexual activity: Not on file  Lifestyle  . Physical activity:    Days per week: Not on file    Minutes per session: Not on file  . Stress: Not on file  Relationships  . Social connections:    Talks on phone: Not on file    Gets together: Not on file    Attends religious  service: Not on file    Active member of club or organization: Not on file    Attends meetings of clubs or organizations: Not on file    Relationship status: Not on file  . Intimate partner violence:    Fear of current or ex partner: Not on file    Emotionally abused: Not on file    Physically abused: Not on file    Forced sexual activity: Not on file  Other Topics Concern  . Not on file  Social History Narrative   2 children, both grown one lives in [place] (son) he has 3 children, and one in Prison (son)   Works for [employer] in [employer].   Single,  Completed 12th grade   Has a dog.    Enjoys relaxing, sleeping    Past Surgical History:  Procedure Laterality Date  . ABDOMINAL HYSTERECTOMY  1990s  . CARPAL TUNNEL RELEASE    . KNEE ARTHROPLASTY    . SPINE SURGERY    . TONSILLECTOMY  1990s  . TOTAL SHOULDER ARTHROPLASTY  Family History  Problem Relation Age of Onset  . Heart disease Mother   . Arthritis Father   . Heart disease Father   . Stroke Father   . Diabetes Father   . Hypertension Father   . Heart attack Father   . Diabetes Brother   . Stroke Brother   . Diabetes Brother   . Stroke Brother   . Stroke Sister   . Diabetes Sister   . Stroke Paternal Aunt   . Hypertension Paternal Aunt   . Diabetes Paternal Aunt   . Stroke Paternal Uncle   . Hypertension Paternal Uncle   . Diabetes Paternal Uncle   . Arthritis Maternal Grandmother   . Arthritis Maternal Grandfather   . Arthritis Paternal Grandmother   . Arthritis Paternal Grandfather     Allergies  Allergen Reactions  . Hydrocodone Shortness Of Breath  . Iodine Itching  . Morphine And Related Itching  . Codeine Nausea And Vomiting    Current Outpatient Medications on File Prior to Visit  Medication Sig Dispense Refill  . albuterol (PROVENTIL HFA;VENTOLIN HFA) 108 (90 Base) MCG/ACT inhaler Inhale into the lungs every 6 (six) hours as needed for wheezing or shortness of breath.    Marland Kitchen.  amitriptyline (ELAVIL) 10 MG tablet Take 1-2 tablets (10-20 mg total) by mouth at bedtime. 180 tablet 0  . budesonide-formoterol (SYMBICORT) 160-4.5 MCG/ACT inhaler Inhale 2 puffs into the lungs 2 (two) times daily.    . diclofenac (VOLTAREN) 75 MG EC tablet TAKE 1 TABLET (75 MG TOTAL) BY MOUTH 2 (TWO) TIMES DAILY WITH A MEAL. 60 tablet 3  . fluticasone (FLONASE) 50 MCG/ACT nasal spray Place into the nose as needed.    . gabapentin (NEURONTIN) 100 MG capsule TAKE 1 CAPSULE BY MOUTH THREE TIMES A DAY 90 capsule 3   No current facility-administered medications on file prior to visit.     BP 126/78 (BP Location: Right Arm, Patient Position: Sitting, Cuff Size: Large)   Pulse 100   Temp 98.4 F (36.9 C) (Oral)   Resp 16   Ht 5\' 2"  (1.575 m)   Wt 214 lb (97.1 kg)   SpO2 100%   BMI 39.14 kg/m       Objective:   Physical Exam Constitutional:      Appearance: She is well-developed.  HENT:     Right Ear: Tympanic membrane and ear canal normal.     Left Ear: Tympanic membrane and ear canal normal.  Neck:     Musculoskeletal: Neck supple.     Thyroid: No thyromegaly.  Cardiovascular:     Rate and Rhythm: Normal rate and regular rhythm.     Heart sounds: Normal heart sounds. No murmur.  Pulmonary:     Effort: Pulmonary effort is normal.     Breath sounds: Normal breath sounds. No wheezing.     Comments: Exam limited due to coughing Skin:    General: Skin is warm and dry.  Neurological:     Mental Status: She is alert and oriented to person, place, and time.  Psychiatric:        Behavior: Behavior normal.        Thought Content: Thought content normal.        Judgment: Judgment normal.           Assessment & Plan:  Influenza A- Rapid flu +.  Advised pt as follows:  Please begin antibiotics (levaquin). Begin prednisone taper. You may use tessalon as needed for cough.  Please cal if symptoms worsen or if not improved in 2-3 days.  Complete chest x-ray on the first floor.

## 2018-05-09 NOTE — Patient Instructions (Addendum)
Please begin antibiotics (levaquin). Begin prednisone taper. You may use tessalon as needed for cough.  Please cal if symptoms worsen or if not improved in 2-3 days.  Complete chest x-ray on the first floor.    Influenza, Adult Influenza is also called "the flu." It is an infection in the lungs, nose, and throat (respiratory tract). It is caused by a virus. The flu causes symptoms that are similar to symptoms of a cold. It also causes a high fever and body aches. The flu spreads easily from person to person (is contagious). Getting a flu shot (influenza vaccination) every year is the best way to prevent the flu. What are the causes? This condition is caused by the influenza virus. You can get the virus by:  Breathing in droplets that are in the air from the cough or sneeze of a person who has the virus.  Touching something that has the virus on it (is contaminated) and then touching your mouth, nose, or eyes. What increases the risk? Certain things may make you more likely to get the flu. These include:  Not washing your hands often.  Having close contact with many people during cold and flu season.  Touching your mouth, eyes, or nose without first washing your hands.  Not getting a flu shot every year. You may have a higher risk for the flu, along with serious problems such as a lung infection (pneumonia), if you:  Are older than 65.  Are pregnant.  Have a weakened disease-fighting system (immune system) because of a disease or taking certain medicines.  Have a long-term (chronic) illness, such as: ? Heart, kidney, or lung disease. ? Diabetes. ? Asthma.  Have a liver disorder.  Are very overweight (morbidly obese).  Have anemia. This is a condition that affects your red blood cells. What are the signs or symptoms? Symptoms usually begin suddenly and last 4-14 days. They may include:  Fever and chills.  Headaches, body aches, or muscle aches.  Sore  throat.  Cough.  Runny or stuffy (congested) nose.  Chest discomfort.  Not wanting to eat as much as normal (poor appetite).  Weakness or feeling tired (fatigue).  Dizziness.  Feeling sick to your stomach (nauseous) or throwing up (vomiting). How is this treated? If the flu is found early, you can be treated with medicine that can help reduce how bad the illness is and how long it lasts (antiviral medicine). This may be given by mouth (orally) or through an IV tube. Taking care of yourself at home can help your symptoms get better. Your doctor may suggest:  Taking over-the-counter medicines.  Drinking plenty of fluids. The flu often goes away on its own. If you have very bad symptoms or other problems, you may be treated in a hospital. Follow these instructions at home:     Activity  Rest as needed. Get plenty of sleep.  Stay home from work or school as told by your doctor. ? Do not leave home until you do not have a fever for 24 hours without taking medicine. ? Leave home only to visit your doctor. Eating and drinking  Take an ORS (oral rehydration solution). This is a drink that is sold at pharmacies and stores.  Drink enough fluid to keep your pee (urine) pale yellow.  Drink clear fluids in small amounts as you are able. Clear fluids include: ? Water. ? Ice chips. ? Fruit juice that has water added (diluted fruit juice). ? Low-calorie sports drinks.  Eat bland, easy-to-digest foods in small amounts as you are able. These foods include: ? Bananas. ? Applesauce. ? Rice. ? Lean meats. ? Toast. ? Crackers.  Do not eat or drink: ? Fluids that have a lot of sugar or caffeine. ? Alcohol. ? Spicy or fatty foods. General instructions  Take over-the-counter and prescription medicines only as told by your doctor.  Use a cool mist humidifier to add moisture to the air in your home. This can make it easier for you to breathe.  Cover your mouth and nose when you  cough or sneeze.  Wash your hands with soap and water often, especially after you cough or sneeze. If you cannot use soap and water, use alcohol-based hand sanitizer.  Keep all follow-up visits as told by your doctor. This is important. How is this prevented?   Get a flu shot every year. You may get the flu shot in late summer, fall, or winter. Ask your doctor when you should get your flu shot.  Avoid contact with people who are sick during fall and winter (cold and flu season). Contact a doctor if:  You get new symptoms.  You have: ? Chest pain. ? Watery poop (diarrhea). ? A fever.  Your cough gets worse.  You start to have more mucus.  You feel sick to your stomach.  You throw up. Get help right away if you:  Have shortness of breath.  Have trouble breathing.  Have skin or nails that turn a bluish color.  Have very bad pain or stiffness in your neck.  Get a sudden headache.  Get sudden pain in your face or ear.  Cannot eat or drink without throwing up. Summary  Influenza ("the flu") is an infection in the lungs, nose, and throat. It is caused by a virus.  Take over-the-counter and prescription medicines only as told by your doctor.  Getting a flu shot every year is the best way to avoid getting the flu. This information is not intended to replace advice given to you by your health care provider. Make sure you discuss any questions you have with your health care provider. Document Released: 11/18/2007 Document Revised: 07/27/2017 Document Reviewed: 07/27/2017 Elsevier Interactive Patient Education  2019 [employer].

## 2018-05-11 ENCOUNTER — Telehealth: Payer: Self-pay | Admitting: Family

## 2018-05-11 MED ORDER — ALBUTEROL SULFATE HFA 108 (90 BASE) MCG/ACT IN AERS: 2.0000 | INHALATION_SPRAY | Freq: Four times a day (QID) | RESPIRATORY_TRACT | 2 refills | Status: AC | PRN

## 2018-05-11 NOTE — Telephone Encounter (Signed)
Please advise pt that I sent an albuterol refill to her pharmacy.

## 2018-08-16 ENCOUNTER — Other Ambulatory Visit: Payer: Self-pay | Admitting: Physical Medicine & Rehabilitation

## 2018-08-16 DIAGNOSIS — M797 Fibromyalgia: Secondary | ICD-10-CM

## 2018-08-16 DIAGNOSIS — Z8619 Personal history of other infectious and parasitic diseases: Secondary | ICD-10-CM

## 2018-08-23 ENCOUNTER — Other Ambulatory Visit: Payer: Self-pay | Admitting: *Deleted

## 2018-08-23 DIAGNOSIS — M797 Fibromyalgia: Secondary | ICD-10-CM

## 2018-08-23 DIAGNOSIS — Z8619 Personal history of other infectious and parasitic diseases: Secondary | ICD-10-CM

## 2019-01-01 DIAGNOSIS — Z1231 Encounter for screening mammogram for malignant neoplasm of breast: Secondary | ICD-10-CM | POA: Diagnosis not present

## 2019-01-17 DIAGNOSIS — M2241 Chondromalacia patellae, right knee: Secondary | ICD-10-CM | POA: Diagnosis not present

## 2019-01-17 DIAGNOSIS — M25561 Pain in right knee: Secondary | ICD-10-CM | POA: Diagnosis not present

## 2019-01-17 DIAGNOSIS — M25562 Pain in left knee: Secondary | ICD-10-CM | POA: Diagnosis not present

## 2019-01-17 DIAGNOSIS — M170 Bilateral primary osteoarthritis of knee: Secondary | ICD-10-CM | POA: Diagnosis not present

## 2019-08-01 NOTE — Progress Notes (Deleted)
Office Visit Note  Patient: Julie Kramer             Date of Birth: [birth year]           MRN: [id number]             PCP: Sandford Craze, NP Referring: Sandford Craze, NP Visit Date: 08/13/2019 Occupation: @GUAROCC @  Subjective:  No chief complaint on file.   History of Present Illness: Julie Kramer is a 54 y.o. female ***   Activities of Daily Living:  Patient reports morning stiffness for *** {minute/hour:19697}.   Patient {ACTIONS;DENIES/REPORTS:21021675::"Denies"} nocturnal pain.  Difficulty dressing/grooming: {ACTIONS;DENIES/REPORTS:21021675::"Denies"} Difficulty climbing stairs: {ACTIONS;DENIES/REPORTS:21021675::"Denies"} Difficulty getting out of chair: {ACTIONS;DENIES/REPORTS:21021675::"Denies"} Difficulty using hands for taps, buttons, cutlery, and/or writing: {ACTIONS;DENIES/REPORTS:21021675::"Denies"}  No Rheumatology ROS completed.   PMFS History:  Patient Active Problem List   Diagnosis Date Noted  . History of Lyme disease 09/30/2016  . Plantar fasciitis 09/30/2016  . Primary osteoarthritis of both knees 05/05/2016  . Primary osteoarthritis of both hands 05/05/2016  . Primary osteoarthritis of both feet 05/05/2016  . Fibromyalgia 05/05/2016  . History of asthma 05/05/2016  . Myalgia 01/08/2016  . Hand pain 01/08/2016  . Other fatigue 01/08/2016  . Preventative health care 08/13/2015  . Rheumatoid factor positive 08/13/2015  . Lyme disease 07/22/2015  . Asthma 06/17/2015  . Other insomnia 06/17/2015    Past Medical History:  Diagnosis Date  . 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 Allergy   . Asthma   . Blood transfusion without reported diagnosis 1990s  . Depression   . Fibromyalgia   . History of chicken pox   . History of colon polyps   . History of migraine   . Lyme disease   . Rheumatoid arthritis (HCC)     Family History  Problem Relation Age of Onset  . Heart disease Mother     . Arthritis Father   . Heart disease Father   . Stroke Father   . Diabetes Father   . Hypertension Father   . Heart attack Father   . Diabetes Brother   . Stroke Brother   . Diabetes Brother   . Stroke Brother   . Stroke Sister   . Diabetes Sister   . Stroke Paternal Aunt   . Hypertension Paternal Aunt   . Diabetes Paternal Aunt   . Stroke Paternal Uncle   . Hypertension Paternal Uncle   . Diabetes Paternal Uncle   . Arthritis Maternal Grandmother   . Arthritis Maternal Grandfather   . Arthritis Paternal Grandmother   . Arthritis Paternal Grandfather    Past Surgical History:  Procedure Laterality Date  . ABDOMINAL HYSTERECTOMY  1990s  . CARPAL TUNNEL RELEASE    . KNEE ARTHROPLASTY    . SPINE SURGERY    . TONSILLECTOMY  1990s  . TOTAL SHOULDER ARTHROPLASTY     Social History   Social History Narrative   2 children, both grown one lives in [place] (son) he has 3 children, and one in [place] (son)   Works for [place] in [employer].   Single,  Completed 12th grade   Has a dog.    Enjoys relaxing, sleeping   Immunization History  Administered Date(s) Administered  . Tdap 07/16/2015     Objective: Vital Signs: There were no vitals taken for this visit.   Physical Exam   Musculoskeletal Exam: ***  CDAI Exam: CDAI Score: -- Patient Global: --; Provider  Global: -- Swollen: --; Tender: -- Joint Exam 08/13/2019   No joint exam has been documented for this visit   There is currently no information documented on the homunculus. Go to the Rheumatology activity and complete the homunculus joint exam.  Investigation: No additional findings.  Imaging: No results found.  Recent Labs: Lab Results  Component Value Date   WBC 8.3 06/15/2017   HGB 12.4 06/15/2017   PLT 259 06/15/2017   NA 141 06/15/2017   K 4.4 06/15/2017   CL 108 06/15/2017   CO2 27 06/15/2017   GLUCOSE 91 06/15/2017   BUN 15 06/15/2017   CREATININE 0.73 06/15/2017   BILITOT 0.6  06/15/2017   ALKPHOS 127 (H) 08/12/2015   AST 36 (H) 06/15/2017   ALT 41 (H) 06/15/2017   PROT 6.8 06/15/2017   ALBUMIN 4.0 08/12/2015   CALCIUM 9.5 06/15/2017   GFRAA 109 06/15/2017    Speciality Comments: No specialty comments available.  Procedures:  No procedures performed Allergies: Hydrocodone, Iodine, Morphine and related, and Codeine   Assessment / Plan:     Visit Diagnoses: No diagnosis found.  Orders: No orders of the defined types were placed in this encounter.  No orders of the defined types were placed in this encounter.   Face-to-face time spent with patient was *** minutes. Greater than 50% of time was spent in counseling and coordination of care.  Follow-Up Instructions: No follow-ups on file.   Ofilia Neas, PA-C  Note - This record has been created using Dragon software.  Chart creation errors have been sought, but may not always  have been located. Such creation errors do not reflect on  the standard of medical care.

## 2019-08-13 ENCOUNTER — Ambulatory Visit: Payer: BLUE CROSS/BLUE SHIELD | Admitting: Rheumatology

## 2019-08-20 NOTE — Progress Notes (Signed)
Office Visit Note  Patient: Julie Kramer             Date of Birth: June 12, 1965           MRN: [id number]             PCP: Sandford Craze, NP Referring: Sandford Craze, NP Visit Date: 08/23/2019 Occupation: @GUAROCC @  Subjective:  Muscle tenderness  History of Present Illness: Julie Kramer is a 54 y.o. female with history of fibromyalgia and osteoarthritis.  Patient presents today with severe pain in bilateral upper extremities.  She is been experiencing myalgias and muscle tenderness through both trapezius muscles and over the deltoid insertion site bilaterally.  Her discomfort does not extend below her elbows.  She is not experiencing any paresthesias at this time.  She has not noticed any muscle weakness but has noticed some muscle fatigue due to the severity of her pain.  She has tried applying topical agents as well as a heating pad.  She has also tried massage without any relief.  She is also tried over-the-counter products such as Tylenol, Aleve, and Advil for pain relief.  She has been experiencing severe nocturnal pain which has caused interrupted sleep at night.  She is having worsening fatigue due to the insomnia.  She has been having difficulty working recently due to the severity of her pain and fatigue.  She has concerned about the severity of her discomfort since she has had a C-spine fusion in the past.  She states that the pain feels similar but she is not having any numbness or tingling extending to her hands at this time.  Activities of Daily Living:  Patient reports morning stiffness for 10 minutes.   Patient Reports nocturnal pain.  Difficulty dressing/grooming: Reports Difficulty climbing stairs: Reports Difficulty getting out of chair: Reports Difficulty using hands for taps, buttons, cutlery, and/or writing: Reports  Review of Systems  Constitutional: Positive for fatigue.  HENT: Positive for mouth dryness. Negative for mouth sores and nose  dryness.   Eyes: Negative for pain, visual disturbance and dryness.  Respiratory: Negative for cough, hemoptysis, shortness of breath and difficulty breathing.   Cardiovascular: Negative for chest pain, palpitations, hypertension and swelling in legs/feet.  Gastrointestinal: Positive for constipation and diarrhea. Negative for blood in stool.  Endocrine: Negative for increased urination.  Genitourinary: Negative for difficulty urinating and painful urination.  Musculoskeletal: Positive for arthralgias, joint pain, joint swelling, muscle weakness, morning stiffness and muscle tenderness. Negative for myalgias and myalgias.  Skin: Negative for color change, pallor, rash, hair loss, nodules/bumps, skin tightness, ulcers and sensitivity to sunlight.  Neurological: Negative for dizziness, numbness, headaches and weakness.  Hematological: Negative for swollen glands.  Psychiatric/Behavioral: Positive for sleep disturbance. Negative for depressed mood. The patient is not nervous/anxious.     PMFS History:  Patient Active Problem List   Diagnosis Date Noted  . History of Lyme disease 09/30/2016  . Plantar fasciitis 09/30/2016  . Primary osteoarthritis of both knees 05/05/2016  . Primary osteoarthritis of both hands 05/05/2016  . Primary osteoarthritis of both feet 05/05/2016  . Fibromyalgia 05/05/2016  . History of asthma 05/05/2016  . Myalgia 01/08/2016  . Hand pain 01/08/2016  . Other fatigue 01/08/2016  . Preventative health care 08/13/2015  . Rheumatoid factor positive 08/13/2015  . Lyme disease 07/22/2015  . Asthma 06/17/2015  . Other insomnia 06/17/2015    Past Medical History:  Diagnosis Date  . 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 Allergy   . Asthma   . Blood transfusion without reported diagnosis 1990s  . Depression   . Fibromyalgia   . History of chicken pox   . History of colon polyps   . History of migraine   . Lyme disease   .  Rheumatoid arthritis (HCC)     Family History  Problem Relation Age of Onset  . Heart disease Mother   . Arthritis Father   . Heart disease Father   . Stroke Father   . Diabetes Father   . Hypertension Father   . Heart attack Father   . Diabetes Brother   . Stroke Brother   . Diabetes Brother   . Stroke Brother   . Stroke Sister   . Diabetes Sister   . Stroke Paternal Aunt   . Hypertension Paternal Aunt   . Diabetes Paternal Aunt   . Stroke Paternal Uncle   . Hypertension Paternal Uncle   . Diabetes Paternal Uncle   . Arthritis Maternal Grandmother   . Arthritis Maternal Grandfather   . Arthritis Paternal Grandmother   . Arthritis Paternal Grandfather    Past Surgical History:  Procedure Laterality Date  . ABDOMINAL HYSTERECTOMY  1990s  . CARPAL TUNNEL RELEASE    . KNEE ARTHROPLASTY    . SPINE SURGERY    . TONSILLECTOMY  1990s  . TOTAL SHOULDER ARTHROPLASTY     Social History   Social History Narrative   2 children, both grown one lives in [place] (son) he has 3 children, and one in [place] (son)   Works for [employer] in [employer].   Single,  Completed 12th grade   Has a dog.    Enjoys relaxing, sleeping   Immunization History  Administered Date(s) Administered  . Tdap 07/16/2015     Objective: Vital Signs: BP (!) 149/94 (BP Location: Right Arm, Patient Position: Sitting, Cuff Size: Normal)   Pulse 84   Resp 16   Ht 5' 2.5" (1.588 m)   Wt 214 lb 9.6 oz (97.3 kg)   BMI 38.63 kg/m    Physical Exam Vitals and nursing note reviewed.  Constitutional:      Appearance: She is well-developed.  HENT:     Head: Normocephalic and atraumatic.  Eyes:     Conjunctiva/sclera: Conjunctivae normal.  Pulmonary:     Effort: Pulmonary effort is normal.  Abdominal:     General: Bowel sounds are normal.     Palpations: Abdomen is soft.  Musculoskeletal:     Cervical back: Normal range of motion.  Lymphadenopathy:     Cervical: No cervical adenopathy.    Skin:    General: Skin is warm and dry.     Capillary Refill: Capillary refill takes less than 2 seconds.  Neurological:     Mental Status: She is alert and oriented to person, place, and time.  Psychiatric:        Behavior: Behavior normal.      Musculoskeletal Exam: C-spine has slightly limited range of motion with lateral rotation.  Trapezius muscle tension and tenderness noted bilaterally.  She has tenderness at the deltoid insertion site bilaterally.  Shoulder joints have good range of motion with some discomfort with abduction.  Elbow joints, wrist joints, MCPs, PIPs, DIPs have good range of motion with no synovitis.  She has complete fist formation bilaterally.  Full strength of upper extremities noted.  Hip joints, knee joints, and ankle joints have good range of motion with  no synovitis.  No warmth or effusion of bilateral knee joints noted.  No tenderness or inflammation of ankle joints noted.  No tenderness over trochanteric bursa bilaterally.  CDAI Exam: CDAI Score: -- Patient Global: --; Provider Global: -- Swollen: --; Tender: -- Joint Exam 08/23/2019   No joint exam has been documented for this visit   There is currently no information documented on the homunculus. Go to the Rheumatology activity and complete the homunculus joint exam.  Investigation: No additional findings.  Imaging: No results found.  Recent Labs: Lab Results  Component Value Date   WBC 8.3 06/15/2017   HGB 12.4 06/15/2017   PLT 259 06/15/2017   NA 141 06/15/2017   K 4.4 06/15/2017   CL 108 06/15/2017   CO2 27 06/15/2017   GLUCOSE 91 06/15/2017   BUN 15 06/15/2017   CREATININE 0.73 06/15/2017   BILITOT 0.6 06/15/2017   ALKPHOS 127 (H) 08/12/2015   AST 36 (H) 06/15/2017   ALT 41 (H) 06/15/2017   PROT 6.8 06/15/2017   ALBUMIN 4.0 08/12/2015   CALCIUM 9.5 06/15/2017   GFRAA 109 06/15/2017    Speciality Comments: No specialty comments available.  Procedures:  No procedures  performed Allergies: Hydrocodone, Iodine, Morphine and related, Codeine, and Contrast media [iodinated diagnostic agents]   Assessment / Plan:     Visit Diagnoses: Primary osteoarthritis of both hands - RF+,CCP-, 14-3-3 eta, u/s on 06/15/17- showed mild hyperemia in the right wrist joint otherwise unremarkable: She has no tenderness or synovitis on exam today.  She is able to make a complete fist bilaterally.  She has not had any discomfort in her hands or wrist joints recently.  We will recheck RF and anti-CCP today.  She was advised to notify us if she develops increased joint pain or joint swelling.  Primary osteoarthritis of both knees: Knee joints have good range of motion on exam.  No warmth or effusion was noted.  She has not had any discomfort in her knee joints at this time.  She has occasional discomfort when climbing steps and getting up from a seated position.  Primary osteoarthritis of both feet: She has not experiencing any discomfort in her feet at this time.  She wears proper fitting shoes.  Plantar fasciitis: Resolved.  Fibromyalgia -She has generalized hyperalgesia and positive tender points on exam.  She has been having more severe and frequent fibromyalgia flares.  She has been having severe pain in the trapezius muscles and in bilateral upper extremities extending to both elbows.  She has tried taking Tylenol, Advil, and Aleve but has not noticed any improvement.  She is also tried topical agents without any relief.  She has been using a heating pad and has tried massage with no improvement.  She has been experiencing severe nocturnal pain worsening her insomnia.  She was tearful today in the office due to the severity of her pain.  We will obtain lab work to complete the work-up.  She is not having any paresthesias at this time.  If her symptoms persist or worsen we can consider a NCV with EMG.  She also may benefit from physical therapy.  A prescription for Zanaflex 4 mg 1 tablet by  mouth at bedtime is sent to the pharmacy today to alleviate her muscle spasms.  She will return in 1 week for trapezius trigger point injections.  She was encouraged to continue to use topical agents, heating pad, and massage for symptomatic relief.  Plan: tiZANidine (ZANAFLEX) 4  MG tablet  Trapezius muscle spasm: She presents today with trapezius muscle tension and muscle tenderness bilaterally.  She has been experiencing muscle spasms intermittently.  She has positive tender points over both trapezius and at the deltoid insertion site of both arms.  She has been experiencing muscle fatigue but has full strength of upper and lower extremities on exam today.  She has been experiencing severe nocturnal pain which has worsened her fatigue.  She is having difficulty performing activities at work due to the severity of her discomfort.  We will obtain lab work today for further evaluation.  She will return in 1 week for trapezius trigger point injections once her husband is able to drive her to the appointment.  She will be given a prescription for Zanaflex 4 mg 1 tablet by mouth at bedtime as needed for muscle spasms to help alleviate her discomfort.  Potential side effects were discussed.  She was advised to notify us if she cannot tolerate taking Zanaflex.  She was encouraged to continue to use a heating pad, topical agents, and massage.  If her discomfort persists or worsens we will refer her to physical therapy.  Other fatigue - She has been experiencing severe fatigue secondary to insomnia.  She has been experiencing nocturnal pain which has caused interrupted sleep at night.  She has been unable to exercise due to the discomfort she has been in.  We will obtain the following lab work today for further evaluation.  Plan: CK, TSH, Vitamin B12, Aldolase, Sedimentation rate, Cyclic citrul peptide antibody, IgG, Rheumatoid factor, VITAMIN D 25 Hydroxy (Vit-D Deficiency, Fractures)  Other insomnia: She has been  experiencing severe nocturnal pain which has caused interrupted sleep at night.  Her level of fatigue has increased significantly due to insomnia.  She was tearful today in the office due to the pain and fatigue she is experiencing.  Good sleep hygiene was discussed.  A prescription for Zanaflex 4 mg 1 tablet by mouth at bedtime was sent to the pharmacy.  Potential side effects were discussed.  Rheumatoid factor positive - She has a history of positive rheumatoid factor.  She does not have any obvious clinical features of rheumatoid arthritis at this time.  She has been having increased discomfort in her neck and bilateral shoulders recently.  We will check a sed rate, anti-CCP, and RF today.  Plan: Sedimentation rate, Cyclic citrul peptide antibody, IgG, Rheumatoid factor  Myalgia -She has been experiencing severe muscle tenderness and myalgias in bilateral upper extremities.  She has tenderness over bilateral trapezius muscles and at the deltoid insertion site of both arms.  We will obtain the following labs.  Plan: tiZANidine (ZANAFLEX) 4 MG tablet, CK, TSH, Vitamin B12, Aldolase, VITAMIN D 25 Hydroxy (Vit-D Deficiency, Fractures)  Other medical conditions are listed as follows:  History of asthma  History of Lyme disease  Orders: Orders Placed This Encounter  Procedures  . CK  . TSH  . Vitamin B12  . Aldolase  . Sedimentation rate  . Cyclic citrul peptide antibody, IgG  . Rheumatoid factor  . VITAMIN D 25 Hydroxy (Vit-D Deficiency, Fractures)   Meds ordered this encounter  Medications  . tiZANidine (ZANAFLEX) 4 MG tablet    Sig: Take 1 tablet (4 mg total) by mouth at bedtime.    Dispense:  30 tablet    Refill:  0    Face-to-face time spent with patient was 30 minutes. Greater than 50% of time was spent in counseling and coordination  of care.  Follow-Up Instructions: Return in about 6 months (around 02/23/2020) for Osteoarthritis, Fibromyalgia.   Gearldine Bienenstock, PA-C  Note -  This record has been created using Dragon software.  Chart creation errors have been sought, but may not always  have been located. Such creation errors do not reflect on  the standard of medical care.

## 2019-08-23 ENCOUNTER — Ambulatory Visit (INDEPENDENT_AMBULATORY_CARE_PROVIDER_SITE_OTHER): Payer: BC Managed Care – PPO | Admitting: Physician Assistant

## 2019-08-23 ENCOUNTER — Other Ambulatory Visit: Payer: Self-pay

## 2019-08-23 ENCOUNTER — Encounter: Payer: Self-pay | Admitting: Physician Assistant

## 2019-08-23 VITALS — BP 149/94 | HR 84 | Resp 16 | Ht 62.5 in | Wt 214.6 lb

## 2019-08-23 DIAGNOSIS — M62838 Other muscle spasm: Secondary | ICD-10-CM

## 2019-08-23 DIAGNOSIS — M722 Plantar fascial fibromatosis: Secondary | ICD-10-CM | POA: Diagnosis not present

## 2019-08-23 DIAGNOSIS — M19041 Primary osteoarthritis, right hand: Secondary | ICD-10-CM | POA: Diagnosis not present

## 2019-08-23 DIAGNOSIS — M170 Bilateral primary osteoarthritis of knee: Secondary | ICD-10-CM | POA: Diagnosis not present

## 2019-08-23 DIAGNOSIS — M19071 Primary osteoarthritis, right ankle and foot: Secondary | ICD-10-CM | POA: Diagnosis not present

## 2019-08-23 DIAGNOSIS — M791 Myalgia, unspecified site: Secondary | ICD-10-CM

## 2019-08-23 DIAGNOSIS — M19042 Primary osteoarthritis, left hand: Secondary | ICD-10-CM

## 2019-08-23 DIAGNOSIS — G4709 Other insomnia: Secondary | ICD-10-CM

## 2019-08-23 DIAGNOSIS — R768 Other specified abnormal immunological findings in serum: Secondary | ICD-10-CM

## 2019-08-23 DIAGNOSIS — Z8619 Personal history of other infectious and parasitic diseases: Secondary | ICD-10-CM

## 2019-08-23 DIAGNOSIS — M797 Fibromyalgia: Secondary | ICD-10-CM

## 2019-08-23 DIAGNOSIS — R5383 Other fatigue: Secondary | ICD-10-CM

## 2019-08-23 DIAGNOSIS — M19072 Primary osteoarthritis, left ankle and foot: Secondary | ICD-10-CM

## 2019-08-23 DIAGNOSIS — Z8709 Personal history of other diseases of the respiratory system: Secondary | ICD-10-CM

## 2019-08-23 MED ORDER — TIZANIDINE HCL 4 MG PO TABS: 4.0000 mg | ORAL_TABLET | Freq: Every day | ORAL | 0 refills | Status: DC

## 2019-08-24 LAB — VITAMIN B12: Vitamin B-12: 806 pg/mL (ref 200–1100)

## 2019-08-24 LAB — TSH: TSH: 1.36 mIU/L

## 2019-08-30 ENCOUNTER — Ambulatory Visit (INDEPENDENT_AMBULATORY_CARE_PROVIDER_SITE_OTHER): Payer: BC Managed Care – PPO | Admitting: Physician Assistant

## 2019-08-30 ENCOUNTER — Encounter: Payer: Self-pay | Admitting: Physician Assistant

## 2019-08-30 ENCOUNTER — Other Ambulatory Visit: Payer: Self-pay

## 2019-08-30 VITALS — BP 120/76 | HR 85

## 2019-08-30 DIAGNOSIS — M62838 Other muscle spasm: Secondary | ICD-10-CM | POA: Diagnosis not present

## 2019-08-30 DIAGNOSIS — M79602 Pain in left arm: Secondary | ICD-10-CM

## 2019-08-30 DIAGNOSIS — M791 Myalgia, unspecified site: Secondary | ICD-10-CM

## 2019-08-30 DIAGNOSIS — M79601 Pain in right arm: Secondary | ICD-10-CM

## 2019-08-30 MED ORDER — TRIAMCINOLONE ACETONIDE 40 MG/ML IJ SUSP
10.0000 mg | INTRAMUSCULAR | Status: AC | PRN
  Administered 2019-08-30: 10 mg via INTRAMUSCULAR

## 2019-08-30 MED ORDER — LIDOCAINE HCL 1 % IJ SOLN
0.5000 mL | INTRAMUSCULAR | Status: AC | PRN
  Administered 2019-08-30: .5 mL

## 2019-08-30 MED ORDER — METHOCARBAMOL 500 MG PO TABS: 500.0000 mg | ORAL_TABLET | Freq: Every day | ORAL | 0 refills | Status: AC | PRN

## 2019-08-30 NOTE — Progress Notes (Signed)
   Procedure Note  Patient: Julie Kramer             Date of Birth: May 01, 1965           MRN: [id number]             Visit Date: 08/30/2019  Procedures: Visit Diagnoses:  1. Trapezius muscle spasm   2. Myalgia   3. Pain in both upper extremities     Trigger Point Inj  Date/Time: 08/30/2019 9:55 AM Performed by: Gearldine Bienenstock, PA-C Authorized by: Gearldine Bienenstock, PA-C   Consent Given by:  Patient Site marked: the procedure site was marked   Timeout: prior to procedure the correct patient, procedure, and site was verified   Indications:  Pain Total # of Trigger Points:  2 Location: neck   Needle Size:  27 G Approach:  Dorsal Medications #1:  0.5 mL lidocaine 1 %; 10 mg triamcinolone acetonide 40 MG/ML Medications #2:  0.5 mL lidocaine 1 %; 10 mg triamcinolone acetonide 40 MG/ML Patient tolerance:  Patient tolerated the procedure well with no immediate complications   Patient tolerated the procedure well.  Aftercare was discussed.  She continues to have severe muscle spasms and has had ongoing difficulty sleeping at night.  She has tried taking zanaflex 4 mg po at bedtime but has not noticed much improvement and has had excessive drowsiness.  I will discontinue to the prescription for zanaflex.  She would like to try robaxin 500 mg 1 tablet by mouth daily as needed for muscle spasms.  Potential side effects were discussed.  Prescription will be sent to the pharmacy.  I will also be referring her to neurology for further evaluation of the symptoms she is experiencing. We discussed proceeding we with a NCV with EMG. We will also obtain lab work that was not drawn last week due to dehydration.    Sherron Ales, PA-C

## 2019-08-31 ENCOUNTER — Encounter: Payer: Self-pay | Admitting: Neurology

## 2019-08-31 LAB — RHEUMATOID FACTOR: Rheumatoid fact SerPl-aCnc: 79 IU/mL — ABNORMAL HIGH (ref <14)

## 2019-08-31 LAB — CYCLIC CITRUL PEPTIDE ANTIBODY, IGG: Cyclic Citrullin Peptide Ab: <16 UNITS

## 2019-08-31 LAB — CK: Total CK: 170 U/L — ABNORMAL HIGH (ref 29–143)

## 2019-08-31 LAB — ALDOLASE: Aldolase: 5 U/L (ref <=8.1)

## 2019-08-31 LAB — SEDIMENTATION RATE: Sed Rate: 6 mm/h (ref 0–30)

## 2019-08-31 LAB — VITAMIN D 25 HYDROXY (VIT D DEFICIENCY, FRACTURES): Vit D, 25-Hydroxy: 8 ng/mL — ABNORMAL LOW (ref 30–100)

## 2019-08-31 NOTE — Progress Notes (Signed)
Vitamin D is extremely low-8.  Please notify the patient and send in vitamin D 50,000 units by mouth twice weekly for 3 months. We will recheck vitamin D in 3 months. Please place future order for vitamin D.  Please explain that vitamin D deficiency could be contributing to some of the symptoms she has been experiencing (myalgias, arthralgias, and fatigue).  Hopefully her symptoms will improve once starting the supplement.   TSH and vitamin B12 are WNL.  Total Ck is mildly elevated.  Aldolase is pending.  ESR WNL.  RF is positive and trending up.  Anti-CCP is pending.

## 2019-09-06 ENCOUNTER — Other Ambulatory Visit: Payer: Self-pay | Admitting: *Deleted

## 2019-09-06 DIAGNOSIS — E559 Vitamin D deficiency, unspecified: Secondary | ICD-10-CM

## 2019-09-06 MED ORDER — VITAMIN D (ERGOCALCIFEROL) 1.25 MG (50000 UNIT) PO CAPS: 50000.0000 [IU] | ORAL_CAPSULE | ORAL | 0 refills | Status: AC

## 2019-09-06 NOTE — Telephone Encounter (Signed)
-----  Message from Ofilia Neas, PA-C sent at 08/31/2019  9:53 AM EDT ----- Vitamin D is extremely low-8.  Please notify the patient and send in vitamin D 50,000 units by mouth twice weekly for 3 months. We will recheck vitamin D in 3 months. Please place future order for vitamin D.  Please explain that vitamin D deficiency could be contributing to some of the symptoms she has been experiencing (myalgias, arthralgias, and fatigue).  Hopefully her symptoms will improve once starting the supplement.   TSH and vitamin B12 are WNL.  Total Ck is mildly elevated.  Aldolase is pending.  ESR WNL.  RF is positive and trending up.  Anti-CCP is pending.

## 2019-09-14 ENCOUNTER — Other Ambulatory Visit: Payer: Self-pay | Admitting: Physician Assistant

## 2019-09-14 DIAGNOSIS — M797 Fibromyalgia: Secondary | ICD-10-CM

## 2019-09-14 DIAGNOSIS — M791 Myalgia, unspecified site: Secondary | ICD-10-CM

## 2019-10-10 NOTE — Progress Notes (Signed)
Office Visit Note  Patient: Julie Kramer             Date of Birth: Mar 10, 1965           MRN: [id number]             PCP: Sandford Craze, NP Referring: Sandford Craze, NP Visit Date: 10/22/2019 Occupation: @GUAROCC @  Subjective:  Right shoulder joint pain   History of Present Illness: Julie Kramer is a 54 y.o. female with history of fibromyalgia. She presents today with ongoing pain in the right shoulder joint.  She states the pain feels similar to when she had a torn rotator cuff in the past.  She is having difficulty lifting her right arm due to the severity of pain.  She did not notice any improvement after having trigger point injections.  She has been taking methocarbamol 500 mg 1 tablet daily as needed for muscle spasms.  She also takes Zanaflex 4 mg a mouth at bedtime to help her sleep and relieve some of her muscle spasms.  She would like a refill of zanaflex today.  Activities of Daily Living:  Patient reports morning stiffness for 10 minutes.   Patient Reports nocturnal pain.  Difficulty dressing/grooming: Reports Difficulty climbing stairs: Reports Difficulty getting out of chair: Reports Difficulty using hands for taps, buttons, cutlery, and/or writing: Reports  Review of Systems  Constitutional: Positive for fatigue.  HENT: Negative for mouth sores, mouth dryness and nose dryness.   Eyes: Negative for itching and dryness.  Respiratory: Negative for shortness of breath and difficulty breathing.   Cardiovascular: Negative for chest pain and palpitations.  Gastrointestinal: Negative for blood in stool, constipation and diarrhea.  Endocrine: Negative for increased urination.  Genitourinary: Negative for difficulty urinating.  Musculoskeletal: Positive for arthralgias, joint pain, joint swelling, myalgias, morning stiffness, muscle tenderness and myalgias.  Skin: Negative for color change, rash and redness.  Allergic/Immunologic: Negative for susceptible  to infections.  Neurological: Positive for headaches. Negative for dizziness, numbness, memory loss and weakness.  Hematological: Negative for bruising/bleeding tendency.  Psychiatric/Behavioral: Positive for sleep disturbance. Negative for confusion.    PMFS History:  Patient Active Problem List   Diagnosis Date Noted  . History of Lyme disease 09/30/2016  . Plantar fasciitis 09/30/2016  . Primary osteoarthritis of both knees 05/05/2016  . Primary osteoarthritis of both hands 05/05/2016  . Primary osteoarthritis of both feet 05/05/2016  . Fibromyalgia 05/05/2016  . History of asthma 05/05/2016  . Myalgia 01/08/2016  . Hand pain 01/08/2016  . Other fatigue 01/08/2016  . Preventative health care 08/13/2015  . Rheumatoid factor positive 08/13/2015  . Lyme disease 07/22/2015  . Asthma 06/17/2015  . Other insomnia 06/17/2015    Past Medical History:  Diagnosis Date  . 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 Allergy   . Asthma   . Blood transfusion without reported diagnosis 1990s  . Depression   . Fibromyalgia   . History of chicken pox   . History of colon polyps   . History of migraine   . Lyme disease   . Rheumatoid arthritis (HCC)     Family History  Problem Relation Age of Onset  . Heart disease Mother   . Arthritis Father   . Heart disease Father   . Stroke Father   . Diabetes Father   . Hypertension Father   . Heart attack Father   . Diabetes Brother   . Stroke Brother   .  Diabetes Brother   . Stroke Brother   . Stroke Sister   . Diabetes Sister   . Stroke Paternal Aunt   . Hypertension Paternal Aunt   . Diabetes Paternal Aunt   . Stroke Paternal Uncle   . Hypertension Paternal Uncle   . Diabetes Paternal Uncle   . Arthritis Maternal Grandmother   . Arthritis Maternal Grandfather   . Arthritis Paternal Grandmother   . Arthritis Paternal Grandfather    Past Surgical History:  Procedure Laterality Date  . ABDOMINAL  HYSTERECTOMY  1990s  . CARPAL TUNNEL RELEASE    . KNEE ARTHROPLASTY    . SPINE SURGERY    . TONSILLECTOMY  1990s  . TOTAL SHOULDER ARTHROPLASTY     Social History   Social History Narrative   2 children, both grown one lives in [place] (son) he has 3 children, and one in [place] (son)   Works for [employer] in [employer].   Single,  Completed 12th grade   Has a dog.    Enjoys relaxing, sleeping   Immunization History  Administered Date(s) Administered  . Janssen (J&J) SARS-COV-2 Vaccination 06/21/2019  . Tdap 07/16/2015     Objective: Vital Signs: BP 119/84 (BP Location: Left Arm, Patient Position: Sitting, Cuff Size: Large)   Pulse 81   Resp 16   Ht 5' 2.5" (1.588 m)   Wt 211 lb 9.6 oz (96 kg)   BMI 38.09 kg/m    Physical Exam Vitals and nursing note reviewed.  Constitutional:      Appearance: She is well-developed.  HENT:     Head: Normocephalic and atraumatic.  Eyes:     Conjunctiva/sclera: Conjunctivae normal.  Pulmonary:     Effort: Pulmonary effort is normal.  Abdominal:     Palpations: Abdomen is soft.  Musculoskeletal:     Cervical back: Normal range of motion.  Skin:    General: Skin is warm and dry.     Capillary Refill: Capillary refill takes less than 2 seconds.  Neurological:     Mental Status: She is alert and oriented to person, place, and time.  Psychiatric:        Behavior: Behavior normal.      Musculoskeletal Exam: C-spine has good range of motion with some discomfort.  Thoracic and lumbar spine have good range of motion.  She has painful limited range of motion of the right shoulder joint.  Limited forward flexion and internal rotation of the right shoulder.  Left shoulder has full range of motion with no discomfort.  Elbow joints, wrist joints, MCPs, PIPs, DIPs have good range of motion with no synovitis.  She has complete fist formation bilaterally.  Hip joints, knee joints, ankle joints, MTPs, PIPs, DIPs have good range of motion with  no synovitis.  No warmth or effusion of knee joints noted.  No tenderness or swelling of ankle joints.   CDAI Exam: CDAI Score: -- Patient Global: --; Provider Global: -- Swollen: --; Tender: -- Joint Exam 10/22/2019   No joint exam has been documented for this visit   There is currently no information documented on the homunculus. Go to the Rheumatology activity and complete the homunculus joint exam.  Investigation: No additional findings.  Imaging: XR Shoulder Right  Result Date: 10/22/2019 No glenohumeral joint space narrowing was noted.  Spurring of acromion was noted.  Narrowing of acromioclavicular joint space was noted.  No chondrocalcinosis was noted.  No previous x-rays were available. Impression: These findings are consistent with  acromial spurring and acromioclavicular arthritis.   Recent Labs: Lab Results  Component Value Date   WBC 8.3 06/15/2017   HGB 12.4 06/15/2017   PLT 259 06/15/2017   NA 141 06/15/2017   K 4.4 06/15/2017   CL 108 06/15/2017   CO2 27 06/15/2017   GLUCOSE 91 06/15/2017   BUN 15 06/15/2017   CREATININE 0.73 06/15/2017   BILITOT 0.6 06/15/2017   ALKPHOS 127 (H) 08/12/2015   AST 36 (H) 06/15/2017   ALT 41 (H) 06/15/2017   PROT 6.8 06/15/2017   ALBUMIN 4.0 08/12/2015   CALCIUM 9.5 06/15/2017   GFRAA 109 06/15/2017    Speciality Comments: No specialty comments available.  Procedures:  Large Joint Inj on 10/22/2019 11:28 AM Indications: pain Details: 27 G 1.5 in needle, posterior approach  Arthrogram: No  Medications: 1.5 mL lidocaine 1 %; 40 mg triamcinolone acetonide 40 MG/ML Aspirate: 0 mL Outcome: tolerated well, no immediate complications Procedure, treatment alternatives, risks and benefits explained, specific risks discussed. Consent was given by the patient. Immediately prior to procedure a time out was called to verify the correct patient, procedure, equipment, support staff and site/side marked as required. Patient was  prepped and draped in the usual sterile fashion.     Allergies: Hydrocodone, Iodine, Morphine and related, Codeine, and Contrast media [iodinated diagnostic agents]   Assessment / Plan:     Visit Diagnoses: Fibromyalgia: Her generalized myalgias and muscle tenderness due to underlying fibromyalgia have been improving.  She has had less frequent and severe flares.  Her myalgias and fatigue have started to improve since starting on vitamin D 50,000 units by mouth twice weekly.  Her trapezius muscle spasms have become less frequent since having trigger point injections performed on 08/30/2019.  We discussed the importance of regular exercise and good sleep hygiene.  She will follow-up in January 2022.  Trapezius muscle spasm: She is not experiencing any trapezius muscle spasms at this time.  She had trigger point injections performed on 08/30/2019.  She continues to take Robaxin 500 mg 1 tablet daily as needed for muscle spasms and Zanaflex 4 mg by mouth at bedtime for muscle spasms.  A refill of zanaflex was sent to the pharmacy today.   Primary osteoarthritis of both hands: She is not experiencing any discomfort, stiffness, or swelling in her hands.  She has no synovitis on exam.  She has no clinical features of rheumatoid arthritis.  Joint protection and muscle strengthening were discussed.  Chronic right shoulder pain - She presents today with ongoing right shoulder joint pain.  She has painful limited abduction and internal rotation of the right shoulder.  She has been experiencing ongoing nocturnal pain.  She has muscle spasms intermittently and takes Robaxin 500 mg 1 tablet daily as needed and Zanaflex 4 mg by mouth at bedtime.  According to the patient she had a torn rotator cuff in the past which was repaired.  She has not seen an orthopedist recently.  X-rays of the right shoulder joint were obtained today.  She requested a right shoulder joint cortisone injection.  She tolerated the procedure well.   The procedure note was completed above.  Aftercare was discussed.  We will schedule an MRI of the right shoulder for further evaluation.  She was given a handout of shoulder joint exercises to perform.  Plan: XR Shoulder Right  Primary osteoarthritis of both knees: She has good range of motion of both knee joints on exam with no discomfort.  No warmth  or effusion noted.  Primary osteoarthritis of both feet: She is not experiencing any discomfort in her feet at this time.  She is wearing proper fitting shoes.  Plantar fasciitis: Resolved.    Other fatigue: Chronic but stable.  Her fatigue has started to improve since starting on vitamin D 50,000 units by mouth twice weekly.  Other insomnia: She has been taking Zanaflex 4 mg a mouth at bedtime to help with muscle spasms and insomnia.  She requested a refill to be sent to the pharmacy today.  Rheumatoid factor positive: She has no clinical features of rheumatoid arthritis.  She had an ultrasound of both hands performed on 06/15/2017 which did not reveal any synovitis or tenosynovitis  Mild hyperemia in the right wrist joint noted.  She has no tenderness or synovitis of the right wrist joint on exam.  Other medical conditions are listed as follows:   History of Lyme disease  History of asthma    Orders: Orders Placed This Encounter  Procedures  . Large Joint Inj  . XR Shoulder Right   No orders of the defined types were placed in this encounter.   Face-to-face time spent with patient was 30 minutes. Greater than 50% of time was spent in counseling and coordination of care.  Follow-Up Instructions: Return for Fibromyalgia, Osteoarthritis.   Gearldine Bienenstock, PA-C  Note - This record has been created using Dragon software.  Chart creation errors have been sought, but may not always  have been located. Such creation errors do not reflect on  the standard of medical care.

## 2019-10-11 ENCOUNTER — Ambulatory Visit: Payer: BC Managed Care – PPO | Admitting: Physician Assistant

## 2019-10-22 ENCOUNTER — Other Ambulatory Visit: Payer: Self-pay

## 2019-10-22 ENCOUNTER — Ambulatory Visit (INDEPENDENT_AMBULATORY_CARE_PROVIDER_SITE_OTHER): Payer: BC Managed Care – PPO | Admitting: Physician Assistant

## 2019-10-22 ENCOUNTER — Encounter: Payer: Self-pay | Admitting: Physician Assistant

## 2019-10-22 ENCOUNTER — Ambulatory Visit: Payer: Self-pay

## 2019-10-22 VITALS — BP 119/84 | HR 81 | Resp 16 | Ht 62.5 in | Wt 211.6 lb

## 2019-10-22 DIAGNOSIS — R768 Other specified abnormal immunological findings in serum: Secondary | ICD-10-CM

## 2019-10-22 DIAGNOSIS — M170 Bilateral primary osteoarthritis of knee: Secondary | ICD-10-CM

## 2019-10-22 DIAGNOSIS — M62838 Other muscle spasm: Secondary | ICD-10-CM

## 2019-10-22 DIAGNOSIS — M19072 Primary osteoarthritis, left ankle and foot: Secondary | ICD-10-CM

## 2019-10-22 DIAGNOSIS — R5383 Other fatigue: Secondary | ICD-10-CM

## 2019-10-22 DIAGNOSIS — G4709 Other insomnia: Secondary | ICD-10-CM

## 2019-10-22 DIAGNOSIS — M797 Fibromyalgia: Secondary | ICD-10-CM

## 2019-10-22 DIAGNOSIS — Z8619 Personal history of other infectious and parasitic diseases: Secondary | ICD-10-CM

## 2019-10-22 DIAGNOSIS — M19041 Primary osteoarthritis, right hand: Secondary | ICD-10-CM

## 2019-10-22 DIAGNOSIS — M19071 Primary osteoarthritis, right ankle and foot: Secondary | ICD-10-CM

## 2019-10-22 DIAGNOSIS — M25511 Pain in right shoulder: Secondary | ICD-10-CM | POA: Diagnosis not present

## 2019-10-22 DIAGNOSIS — G8929 Other chronic pain: Secondary | ICD-10-CM

## 2019-10-22 DIAGNOSIS — Z8709 Personal history of other diseases of the respiratory system: Secondary | ICD-10-CM

## 2019-10-22 DIAGNOSIS — M19042 Primary osteoarthritis, left hand: Secondary | ICD-10-CM

## 2019-10-22 DIAGNOSIS — M722 Plantar fascial fibromatosis: Secondary | ICD-10-CM

## 2019-10-22 MED ORDER — TIZANIDINE HCL 4 MG PO TABS: 4.0000 mg | ORAL_TABLET | Freq: Every day | ORAL | 0 refills | Status: DC

## 2019-10-22 NOTE — Patient Instructions (Signed)
Shoulder Exercises Ask your health care provider which exercises are safe for you. Do exercises exactly as told by your health care provider and adjust them as directed. It is normal to feel mild stretching, pulling, tightness, or discomfort as you do these exercises. Stop right away if you feel sudden pain or your pain gets worse. Do not begin these exercises until told by your health care provider. Stretching exercises External rotation and abduction This exercise is sometimes called corner stretch. This exercise rotates your arm outward (external rotation) and moves your arm out from your body (abduction). 1. Stand in a doorway with one of your feet slightly in front of the other. This is called a staggered stance. If you cannot reach your forearms to the door frame, stand facing a corner of a room. 2. Choose one of the following positions as told by your health care provider: ? Place your hands and forearms on the door frame above your head. ? Place your hands and forearms on the door frame at the height of your head. ? Place your hands on the door frame at the height of your elbows. 3. Slowly move your weight onto your front foot until you feel a stretch across your chest and in the front of your shoulders. Keep your head and chest upright and keep your abdominal muscles tight. 4. Hold for __________ seconds. 5. To release the stretch, shift your weight to your back foot. Repeat __________ times. Complete this exercise __________ times a day. Extension, standing 1. Stand and hold a broomstick, a cane, or a similar object behind your back. ? Your hands should be a little wider than shoulder width apart. ? Your palms should face away from your back. 2. Keeping your elbows straight and your shoulder muscles relaxed, move the stick away from your body until you feel a stretch in your shoulders (extension). ? Avoid shrugging your shoulders while you move the stick. Keep your shoulder blades tucked  down toward the middle of your back. 3. Hold for __________ seconds. 4. Slowly return to the starting position. Repeat __________ times. Complete this exercise __________ times a day. Range-of-motion exercises Pendulum  1. Stand near a wall or a surface that you can hold onto for balance. 2. Bend at the waist and let your left / right arm hang straight down. Use your other arm to support you. Keep your back straight and do not lock your knees. 3. Relax your left / right arm and shoulder muscles, and move your hips and your trunk so your left / right arm swings freely. Your arm should swing because of the motion of your body, not because you are using your arm or shoulder muscles. 4. Keep moving your hips and trunk so your arm swings in the following directions, as told by your health care provider: ? Side to side. ? Forward and backward. ? In clockwise and counterclockwise circles. 5. Continue each motion for __________ seconds, or for as long as told by your health care provider. 6. Slowly return to the starting position. Repeat __________ times. Complete this exercise __________ times a day. Shoulder flexion, standing  1. Stand and hold a broomstick, a cane, or a similar object. Place your hands a little more than shoulder width apart on the object. Your left / right hand should be palm up, and your other hand should be palm down. 2. Keep your elbow straight and your shoulder muscles relaxed. Push the stick up with your healthy arm to   raise your left / right arm in front of your body, and then over your head until you feel a stretch in your shoulder (flexion). ? Avoid shrugging your shoulder while you raise your arm. Keep your shoulder blade tucked down toward the middle of your back. 3. Hold for __________ seconds. 4. Slowly return to the starting position. Repeat __________ times. Complete this exercise __________ times a day. Shoulder abduction, standing 1. Stand and hold a broomstick,  a cane, or a similar object. Place your hands a little more than shoulder width apart on the object. Your left / right hand should be palm up, and your other hand should be palm down. 2. Keep your elbow straight and your shoulder muscles relaxed. Push the object across your body toward your left / right side. Raise your left / right arm to the side of your body (abduction) until you feel a stretch in your shoulder. ? Do not raise your arm above shoulder height unless your health care provider tells you to do that. ? If directed, raise your arm over your head. ? Avoid shrugging your shoulder while you raise your arm. Keep your shoulder blade tucked down toward the middle of your back. 3. Hold for __________ seconds. 4. Slowly return to the starting position. Repeat __________ times. Complete this exercise __________ times a day. Internal rotation  1. Place your left / right hand behind your back, palm up. 2. Use your other hand to dangle an exercise band, a towel, or a similar object over your shoulder. Grasp the band with your left / right hand so you are holding on to both ends. 3. Gently pull up on the band until you feel a stretch in the front of your left / right shoulder. The movement of your arm toward the center of your body is called internal rotation. ? Avoid shrugging your shoulder while you raise your arm. Keep your shoulder blade tucked down toward the middle of your back. 4. Hold for __________ seconds. 5. Release the stretch by letting go of the band and lowering your hands. Repeat __________ times. Complete this exercise __________ times a day. Strengthening exercises External rotation  1. Sit in a stable chair without armrests. 2. Secure an exercise band to a stable object at elbow height on your left / right side. 3. Place a soft object, such as a folded towel or a small pillow, between your left / right upper arm and your body to move your elbow about 4 inches (10 cm) away  from your side. 4. Hold the end of the exercise band so it is tight and there is no slack. 5. Keeping your elbow pressed against the soft object, slowly move your forearm out, away from your abdomen (external rotation). Keep your body steady so only your forearm moves. 6. Hold for __________ seconds. 7. Slowly return to the starting position. Repeat __________ times. Complete this exercise __________ times a day. Shoulder abduction  1. Sit in a stable chair without armrests, or stand up. 2. Hold a __________ weight in your left / right hand, or hold an exercise band with both hands. 3. Start with your arms straight down and your left / right palm facing in, toward your body. 4. Slowly lift your left / right hand out to your side (abduction). Do not lift your hand above shoulder height unless your health care provider tells you that this is safe. ? Keep your arms straight. ? Avoid shrugging your shoulder while you   do this movement. Keep your shoulder blade tucked down toward the middle of your back. 5. Hold for __________ seconds. 6. Slowly lower your arm, and return to the starting position. Repeat __________ times. Complete this exercise __________ times a day. Shoulder extension 1. Sit in a stable chair without armrests, or stand up. 2. Secure an exercise band to a stable object in front of you so it is at shoulder height. 3. Hold one end of the exercise band in each hand. Your palms should face each other. 4. Straighten your elbows and lift your hands up to shoulder height. 5. Step back, away from the secured end of the exercise band, until the band is tight and there is no slack. 6. Squeeze your shoulder blades together as you pull your hands down to the sides of your thighs (extension). Stop when your hands are straight down by your sides. Do not let your hands go behind your body. 7. Hold for __________ seconds. 8. Slowly return to the starting position. Repeat __________ times.  Complete this exercise __________ times a day. Shoulder row 1. Sit in a stable chair without armrests, or stand up. 2. Secure an exercise band to a stable object in front of you so it is at waist height. 3. Hold one end of the exercise band in each hand. Position your palms so that your thumbs are facing the ceiling (neutral position). 4. Bend each of your elbows to a 90-degree angle (right angle) and keep your upper arms at your sides. 5. Step back until the band is tight and there is no slack. 6. Slowly pull your elbows back behind you. 7. Hold for __________ seconds. 8. Slowly return to the starting position. Repeat __________ times. Complete this exercise __________ times a day. Shoulder press-ups  1. Sit in a stable chair that has armrests. Sit upright, with your feet flat on the floor. 2. Put your hands on the armrests so your elbows are bent and your fingers are pointing forward. Your hands should be about even with the sides of your body. 3. Push down on the armrests and use your arms to lift yourself off the chair. Straighten your elbows and lift yourself up as much as you comfortably can. ? Move your shoulder blades down, and avoid letting your shoulders move up toward your ears. ? Keep your feet on the ground. As you get stronger, your feet should support less of your body weight as you lift yourself up. 4. Hold for __________ seconds. 5. Slowly lower yourself back into the chair. Repeat __________ times. Complete this exercise __________ times a day. Wall push-ups  1. Stand so you are facing a stable wall. Your feet should be about one arm-length away from the wall. 2. Lean forward and place your palms on the wall at shoulder height. 3. Keep your feet flat on the floor as you bend your elbows and lean forward toward the wall. 4. Hold for __________ seconds. 5. Straighten your elbows to push yourself back to the starting position. Repeat __________ times. Complete this exercise  __________ times a day. This information is not intended to replace advice given to you by your health care provider. Make sure you discuss any questions you have with your health care provider. Document Revised: 06/02/2018 Document Reviewed: 03/10/2018 Elsevier Patient Education  2020 [employer]  

## 2019-10-22 NOTE — Progress Notes (Signed)
Right shoulder MRI and prescription for tizanidine. Please review and sign.

## 2019-11-14 ENCOUNTER — Telehealth: Payer: Self-pay | Admitting: *Deleted

## 2019-11-14 NOTE — Telephone Encounter (Signed)
Spoke with patient in regards to her MRI results which reveal a rotator cuff tear, advised patient to follow-up with orthopaedics, patient is in agreement. Patient currently sees Dr. Earma Reading with [facility] and will give their office a call.

## 2019-11-16 ENCOUNTER — Other Ambulatory Visit: Payer: Self-pay | Admitting: Physician Assistant

## 2019-11-16 NOTE — Telephone Encounter (Signed)
Last Visit: 10/22/2019  Next Visit: 02/26/2020  Current Dose per office note on 10/22/2019: Zanaflex 4 mg a mouth at bedtime  Okay to refill tizanidine?

## 2019-11-22 ENCOUNTER — Other Ambulatory Visit: Payer: Self-pay | Admitting: Physician Assistant

## 2019-11-22 NOTE — Telephone Encounter (Signed)
Patient advised we will need to recheck her Vitamin D level prior to refilling this prescription. Patient expressed understanding. Order in place.

## 2019-11-30 ENCOUNTER — Other Ambulatory Visit: Payer: Self-pay | Admitting: Rheumatology

## 2019-12-03 ENCOUNTER — Ambulatory Visit: Payer: BC Managed Care – PPO | Admitting: Neurology

## 2020-01-22 ENCOUNTER — Telehealth: Payer: Self-pay

## 2020-01-22 NOTE — Telephone Encounter (Signed)
Patient advised Dr. Corliss Skains does not know of an orthopedic surgeon in [place] area.  Patient advised may ask her PCP. Patient expressed understanding.

## 2020-01-22 NOTE — Telephone Encounter (Signed)
Patient called stating 2 days after she had her right shoulder MRI she got Covid.  Patient states she never followed up with orthopaedics due to being very sick.  Patient states she also moved to [facility] and is asking for recommendations on an orthopedic doctor to see if that area.  Patient states her right shoulder is extremely sore to the point where she can barely use it and requesting a return call at her work #[phone]

## 2020-01-22 NOTE — Telephone Encounter (Signed)
I do not know of an orthopedic surgeon in [place] area.  She may ask her PCP.

## 2020-02-12 NOTE — Progress Notes (Deleted)
Office Visit Note  Patient: Julie Kramer             Date of Birth: 07/13/1965           MRN: [id number]             PCP: Sandford Craze, NP Referring: Sandford Craze, NP Visit Date: 02/26/2020 Occupation: @GUAROCC @  Subjective:  No chief complaint on file.   History of Present Illness: Julie Kramer is a 54 y.o. female ***   Activities of Daily Living:  Patient reports morning stiffness for *** {minute/hour:19697}.   Patient {ACTIONS;DENIES/REPORTS:21021675::"Denies"} nocturnal pain.  Difficulty dressing/grooming: {ACTIONS;DENIES/REPORTS:21021675::"Denies"} Difficulty climbing stairs: {ACTIONS;DENIES/REPORTS:21021675::"Denies"} Difficulty getting out of chair: {ACTIONS;DENIES/REPORTS:21021675::"Denies"} Difficulty using hands for taps, buttons, cutlery, and/or writing: {ACTIONS;DENIES/REPORTS:21021675::"Denies"}  No Rheumatology ROS completed.   PMFS History:  Patient Active Problem List   Diagnosis Date Noted  . History of Lyme disease 09/30/2016  . Plantar fasciitis 09/30/2016  . Primary osteoarthritis of both knees 05/05/2016  . Primary osteoarthritis of both hands 05/05/2016  . Primary osteoarthritis of both feet 05/05/2016  . Fibromyalgia 05/05/2016  . History of asthma 05/05/2016  . Myalgia 01/08/2016  . Hand pain 01/08/2016  . Other fatigue 01/08/2016  . Preventative health care 08/13/2015  . Rheumatoid factor positive 08/13/2015  . Lyme disease 07/22/2015  . Asthma 06/17/2015  . Other insomnia 06/17/2015    Past Medical History:  Diagnosis Date  . 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 Allergy   . Asthma   . Blood transfusion without reported diagnosis 1990s  . Depression   . Fibromyalgia   . History of chicken pox   . History of colon polyps   . History of migraine   . Lyme disease   . Rheumatoid arthritis (HCC)     Family History  Problem Relation Age of Onset  . Heart disease Mother   .  Arthritis Father   . Heart disease Father   . Stroke Father   . Diabetes Father   . Hypertension Father   . Heart attack Father   . Diabetes Brother   . Stroke Brother   . Diabetes Brother   . Stroke Brother   . Stroke Sister   . Diabetes Sister   . Stroke Paternal Aunt   . Hypertension Paternal Aunt   . Diabetes Paternal Aunt   . Stroke Paternal Uncle   . Hypertension Paternal Uncle   . Diabetes Paternal Uncle   . Arthritis Maternal Grandmother   . Arthritis Maternal Grandfather   . Arthritis Paternal Grandmother   . Arthritis Paternal Grandfather    Past Surgical History:  Procedure Laterality Date  . ABDOMINAL HYSTERECTOMY  1990s  . CARPAL TUNNEL RELEASE    . KNEE ARTHROPLASTY    . SPINE SURGERY    . TONSILLECTOMY  1990s  . TOTAL SHOULDER ARTHROPLASTY     Social History   Social History Narrative   2 children, both grown one lives in [place]2015 (son) he has 3 children, and one in [place] (son)   Works for [employer] in [employer].   Single,  Completed 12th grade   Has a dog.    Enjoys relaxing, sleeping   Immunization History  Administered Date(s) Administered  . Influenza Inj Mdck Quad Pf 12/07/2016  . Influenza, Quadrivalent, Recombinant, Inj, Pf 12/15/2018  . Influenza,inj,Quad PF,6+ Mos 12/28/2017, 01/24/2020  . Janssen (J&J) SARS-COV-2 Vaccination 06/21/2019  . Pneumococcal Conjugate-13 09/15/2012  . Tdap 07/16/2015  Objective: Vital Signs: There were no vitals taken for this visit.   Physical Exam   Musculoskeletal Exam: ***  CDAI Exam: CDAI Score: -- Patient Global: --; Provider Global: -- Swollen: --; Tender: -- Joint Exam 02/26/2020   No joint exam has been documented for this visit   There is currently no information documented on the homunculus. Go to the Rheumatology activity and complete the homunculus joint exam.  Investigation: No additional findings.  Imaging: No results found.  Recent Labs: Lab Results  Component  Value Date   WBC 8.3 06/15/2017   HGB 12.4 06/15/2017   PLT 259 06/15/2017   NA 141 06/15/2017   K 4.4 06/15/2017   CL 108 06/15/2017   CO2 27 06/15/2017   GLUCOSE 91 06/15/2017   BUN 15 06/15/2017   CREATININE 0.73 06/15/2017   BILITOT 0.6 06/15/2017   ALKPHOS 127 (H) 08/12/2015   AST 36 (H) 06/15/2017   ALT 41 (H) 06/15/2017   PROT 6.8 06/15/2017   ALBUMIN 4.0 08/12/2015   CALCIUM 9.5 06/15/2017   GFRAA 109 06/15/2017    Speciality Comments: No specialty comments available.  Procedures:  No procedures performed Allergies: Hydrocodone, Iodine, Morphine and related, Codeine, and Contrast media [iodinated diagnostic agents]   Assessment / Plan:     Visit Diagnoses: Fibromyalgia  Trapezius muscle spasm  Primary osteoarthritis of both knees  Primary osteoarthritis of both hands  Rheumatoid factor positive  Chronic right shoulder pain  Primary osteoarthritis of both feet  Plantar fasciitis  Other fatigue  Other insomnia  History of Lyme disease  History of asthma  Orders: No orders of the defined types were placed in this encounter.  No orders of the defined types were placed in this encounter.   Face-to-face time spent with patient was *** minutes. Greater than 50% of time was spent in counseling and coordination of care.  Follow-Up Instructions: No follow-ups on file.   Gearldine Bienenstock, PA-C  Note - This record has been created using Dragon software.  Chart creation errors have been sought, but may not always  have been located. Such creation errors do not reflect on  the standard of medical care.

## 2020-02-26 ENCOUNTER — Ambulatory Visit: Payer: BC Managed Care – PPO | Admitting: Physician Assistant

## 2020-02-26 NOTE — Progress Notes (Deleted)
Office Visit Note  Patient: Julie Kramer             Date of Birth: [birth year]           MRN: [id number]             PCP: Sandford Craze, NP Referring: Sandford Craze, NP Visit Date: 03/06/2020 Occupation: @GUAROCC @  Subjective:    History of Present Illness: Julie Kramer is a 55 y.o. female with history of fibromyalgia and osteoarthritis.   Activities of Daily Living:  Patient reports morning stiffness for *** {minute/hour:19697}.   Patient {ACTIONS;DENIES/REPORTS:21021675::"Denies"} nocturnal pain.  Difficulty dressing/grooming: {ACTIONS;DENIES/REPORTS:21021675::"Denies"} Difficulty climbing stairs: {ACTIONS;DENIES/REPORTS:21021675::"Denies"} Difficulty getting out of chair: {ACTIONS;DENIES/REPORTS:21021675::"Denies"} Difficulty using hands for taps, buttons, cutlery, and/or writing: {ACTIONS;DENIES/REPORTS:21021675::"Denies"}  Review of Systems  Constitutional: Negative for fatigue.  HENT: Negative for mouth sores, mouth dryness and nose dryness.   Eyes: Negative for pain, visual disturbance and dryness.  Respiratory: Negative for cough, hemoptysis, shortness of breath and difficulty breathing.   Cardiovascular: Negative for chest pain, palpitations, hypertension and swelling in legs/feet.  Gastrointestinal: Negative for blood in stool, constipation and diarrhea.  Endocrine: Negative for increased urination.  Genitourinary: Negative for painful urination.  Musculoskeletal: Negative for arthralgias, joint pain, joint swelling, myalgias, muscle weakness, morning stiffness, muscle tenderness and myalgias.  Skin: Negative for color change, pallor, rash, hair loss, nodules/bumps, skin tightness, ulcers and sensitivity to sunlight.  Allergic/Immunologic: Negative for susceptible to infections.  Neurological: Negative for dizziness, numbness, headaches and weakness.  Hematological: Negative for swollen glands.  Psychiatric/Behavioral: Negative for depressed mood  and sleep disturbance. The patient is not nervous/anxious.     PMFS History:  Patient Active Problem List   Diagnosis Date Noted  . History of Lyme disease 09/30/2016  . Plantar fasciitis 09/30/2016  . Primary osteoarthritis of both knees 05/05/2016  . Primary osteoarthritis of both hands 05/05/2016  . Primary osteoarthritis of both feet 05/05/2016  . Fibromyalgia 05/05/2016  . History of asthma 05/05/2016  . Myalgia 01/08/2016  . Hand pain 01/08/2016  . Other fatigue 01/08/2016  . Preventative health care 08/13/2015  . Rheumatoid factor positive 08/13/2015  . Lyme disease 07/22/2015  . Asthma 06/17/2015  . Other insomnia 06/17/2015    Past Medical History:  Diagnosis Date  . 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 Allergy   . Asthma   . Blood transfusion without reported diagnosis 1990s  . Depression   . Fibromyalgia   . History of chicken pox   . History of colon polyps   . History of migraine   . Lyme disease   . Rheumatoid arthritis (HCC)     Family History  Problem Relation Age of Onset  . Heart disease Mother   . Arthritis Father   . Heart disease Father   . Stroke Father   . Diabetes Father   . Hypertension Father   . Heart attack Father   . Diabetes Brother   . Stroke Brother   . Diabetes Brother   . Stroke Brother   . Stroke Sister   . Diabetes Sister   . Stroke Paternal Aunt   . Hypertension Paternal Aunt   . Diabetes Paternal Aunt   . Stroke Paternal Uncle   . Hypertension Paternal Uncle   . Diabetes Paternal Uncle   . Arthritis Maternal Grandmother   . Arthritis Maternal Grandfather   . Arthritis Paternal Grandmother   . Arthritis Paternal Grandfather    Past  Surgical History:  Procedure Laterality Date  . ABDOMINAL HYSTERECTOMY  1990s  . CARPAL TUNNEL RELEASE    . KNEE ARTHROPLASTY    . SPINE SURGERY    . TONSILLECTOMY  1990s  . TOTAL SHOULDER ARTHROPLASTY     Social History   Social History  Narrative   2 children, both grown one lives in [place] (son) he has 3 children, and one in Prison (son)   Works for [employer] in [employer].   Single,  Completed 12th grade   Has a dog.    Enjoys relaxing, sleeping   Immunization History  Administered Date(s) Administered  . Influenza Inj Mdck Quad Pf 12/07/2016  . Influenza, Quadrivalent, Recombinant, Inj, Pf 12/15/2018  . Influenza,inj,Quad PF,6+ Mos 12/28/2017, 01/24/2020  . Janssen (J&J) SARS-COV-2 Vaccination 06/21/2019  . Pneumococcal Conjugate-13 09/15/2012  . Tdap 07/16/2015     Objective: Vital Signs: There were no vitals taken for this visit.   Physical Exam Vitals and nursing note reviewed.  Constitutional:      Appearance: She is well-developed and well-nourished.  HENT:     Head: Normocephalic and atraumatic.  Eyes:     Extraocular Movements: EOM normal.     Conjunctiva/sclera: Conjunctivae normal.  Cardiovascular:     Pulses: Intact distal pulses.  Pulmonary:     Effort: Pulmonary effort is normal.  Abdominal:     Palpations: Abdomen is soft.  Musculoskeletal:     Cervical back: Normal range of motion.  Skin:    General: Skin is warm and dry.     Capillary Refill: Capillary refill takes less than 2 seconds.  Neurological:     Mental Status: She is alert and oriented to person, place, and time.  Psychiatric:        Mood and Affect: Mood and affect normal.        Behavior: Behavior normal.      Musculoskeletal Exam: ***  CDAI Exam: CDAI Score: -- Patient Global: --; Provider Global: -- Swollen: --; Tender: -- Joint Exam 03/06/2020   No joint exam has been documented for this visit   There is currently no information documented on the homunculus. Go to the Rheumatology activity and complete the homunculus joint exam.  Investigation: No additional findings.  Imaging: No results found.  Recent Labs: Lab Results  Component Value Date   WBC 8.3 06/15/2017   HGB 12.4 06/15/2017    PLT 259 06/15/2017   NA 141 06/15/2017   K 4.4 06/15/2017   CL 108 06/15/2017   CO2 27 06/15/2017   GLUCOSE 91 06/15/2017   BUN 15 06/15/2017   CREATININE 0.73 06/15/2017   BILITOT 0.6 06/15/2017   ALKPHOS 127 (H) 08/12/2015   AST 36 (H) 06/15/2017   ALT 41 (H) 06/15/2017   PROT 6.8 06/15/2017   ALBUMIN 4.0 08/12/2015   CALCIUM 9.5 06/15/2017   GFRAA 109 06/15/2017    Speciality Comments: No specialty comments available.  Procedures:  No procedures performed Allergies: Hydrocodone, Iodine, Morphine and related, Codeine, and Contrast media [iodinated diagnostic agents]   Assessment / Plan:     Visit Diagnoses: No diagnosis found.  Orders: No orders of the defined types were placed in this encounter.  No orders of the defined types were placed in this encounter.   Follow-Up Instructions: No follow-ups on file.   Ellen Henri, CMA  Note - This record has been created using Animal nutritionist.  Chart creation errors have been sought, but may not always  have  been located. Such creation errors do not reflect on  the standard of medical care.

## 2020-03-06 ENCOUNTER — Ambulatory Visit: Payer: BC Managed Care – PPO | Admitting: Physician Assistant

## 2020-03-06 DIAGNOSIS — Z8709 Personal history of other diseases of the respiratory system: Secondary | ICD-10-CM

## 2020-03-06 DIAGNOSIS — Z8619 Personal history of other infectious and parasitic diseases: Secondary | ICD-10-CM

## 2020-03-06 DIAGNOSIS — M722 Plantar fascial fibromatosis: Secondary | ICD-10-CM

## 2020-03-06 DIAGNOSIS — R768 Other specified abnormal immunological findings in serum: Secondary | ICD-10-CM

## 2020-03-06 DIAGNOSIS — M19041 Primary osteoarthritis, right hand: Secondary | ICD-10-CM

## 2020-03-06 DIAGNOSIS — M170 Bilateral primary osteoarthritis of knee: Secondary | ICD-10-CM

## 2020-03-06 DIAGNOSIS — M62838 Other muscle spasm: Secondary | ICD-10-CM

## 2020-03-06 DIAGNOSIS — M797 Fibromyalgia: Secondary | ICD-10-CM

## 2020-03-06 DIAGNOSIS — G8929 Other chronic pain: Secondary | ICD-10-CM

## 2020-03-06 DIAGNOSIS — R5383 Other fatigue: Secondary | ICD-10-CM

## 2020-03-06 DIAGNOSIS — M19071 Primary osteoarthritis, right ankle and foot: Secondary | ICD-10-CM

## 2020-03-06 DIAGNOSIS — G4709 Other insomnia: Secondary | ICD-10-CM

## 2020-06-26 LAB — HM COLONOSCOPY
